# Patient Record
Sex: Male | Born: 1965 | Race: Black or African American | Hispanic: No | Marital: Married | Smoking: Never smoker
Health system: Southern US, Community
[De-identification: ages and names within clinical notes are randomized; demographics above are authoritative.]

## PROBLEM LIST (undated history)

## (undated) DIAGNOSIS — I10 Essential (primary) hypertension: Secondary | ICD-10-CM

## (undated) DIAGNOSIS — J302 Other seasonal allergic rhinitis: Secondary | ICD-10-CM

## (undated) DIAGNOSIS — M67439 Ganglion, unspecified wrist: Secondary | ICD-10-CM

## (undated) DIAGNOSIS — S63599A Other specified sprain of unspecified wrist, initial encounter: Secondary | ICD-10-CM

## (undated) HISTORY — PX: KNEE ARTHROSCOPY: SHX127

## (undated) HISTORY — PX: ORCHIOPEXY: SHX479

## (undated) HISTORY — PX: CYST EXCISION: SHX5701

---

## 2011-03-24 ENCOUNTER — Emergency Department (INDEPENDENT_AMBULATORY_CARE_PROVIDER_SITE_OTHER): Payer: Managed Care, Other (non HMO)

## 2011-03-24 ENCOUNTER — Emergency Department (HOSPITAL_BASED_OUTPATIENT_CLINIC_OR_DEPARTMENT_OTHER)
Admission: EM | Admit: 2011-03-24 | Discharge: 2011-03-24 | Disposition: A | Discharge status: Home / Self Care | Payer: Managed Care, Other (non HMO) | Attending: Emergency Medicine | Admitting: Emergency Medicine

## 2011-03-24 DIAGNOSIS — R059 Cough, unspecified: Secondary | ICD-10-CM | POA: Insufficient documentation

## 2011-03-24 DIAGNOSIS — R05 Cough: Secondary | ICD-10-CM | POA: Insufficient documentation

## 2011-03-24 DIAGNOSIS — R0989 Other specified symptoms and signs involving the circulatory and respiratory systems: Secondary | ICD-10-CM

## 2011-03-24 DIAGNOSIS — I10 Essential (primary) hypertension: Secondary | ICD-10-CM | POA: Insufficient documentation

## 2011-03-24 DIAGNOSIS — J40 Bronchitis, not specified as acute or chronic: Secondary | ICD-10-CM | POA: Insufficient documentation

## 2011-03-24 HISTORY — DX: Essential (primary) hypertension: I10

## 2011-03-24 MED ORDER — AZITHROMYCIN 250 MG PO TABS: 250.0000 mg | ORAL_TABLET | Freq: Every day | ORAL | Status: AC

## 2011-03-24 MED ORDER — IBUPROFEN 800 MG PO TABS
800.0000 mg | ORAL_TABLET | Freq: Once | ORAL | Status: AC
  Administered 2011-03-24: 800 mg via ORAL
  Filled 2011-03-24: qty 1

## 2011-03-24 MED ORDER — IBUPROFEN 800 MG PO TABS: 800.0000 mg | ORAL_TABLET | Freq: Three times a day (TID) | ORAL | Status: AC

## 2011-03-24 MED ORDER — HYDROCOD POLST-CHLORPHEN POLST 10-8 MG/5ML PO LQCR: 5.0000 mL | Freq: Two times a day (BID) | ORAL | Status: DC

## 2011-03-24 NOTE — ED Provider Notes (Addendum)
History     CSN: [id number] Arrival date & time: 03/24/2011  8:49 PM   First MD Initiated Contact with Patient 03/24/11 2100      Chief Complaint  Patient presents with  . URI   Patient describes a productive cough in the morning, feels hot, but no documented fevers. He states he began having muscle aches, runny nose, and cold symptoms last week. Now the symptoms have included more of sore throat in the morning and productive cough. No nausea, vomiting. No recent illnesses. Patient does not smoke. (Consider location/radiation/quality/duration/timing/severity/associated sxs/prior treatment) HPI  Past Medical History  Diagnosis Date  . Hypertension     Past Surgical History  Procedure Date  . Knee surgery   . Surgery scrotal / testicular     No family history on file.  History  Substance Use Topics  . Smoking status: Never Smoker   . Smokeless tobacco: Not on file  . Alcohol Use: No      Review of Systems  All other systems reviewed and are negative.    Allergies  Review of patient's allergies indicates not on file.  Home Medications  No current outpatient prescriptions on file.  BP 140/89  Pulse 89  Temp(Src) 98.4 F (36.9 C) (Oral)  Resp 18  Ht 6\' 1"  (1.854 m)  Wt 207 lb (93.895 kg)  BMI 27.31 kg/m2  SpO2 100%  Physical Exam  Constitutional: He is oriented to person, place, and time. He appears well-developed and well-nourished.  HENT:  Head: Normocephalic and atraumatic.  Eyes: Conjunctivae and EOM are normal. Pupils are equal, round, and reactive to light.  Neck: Neck supple.  Cardiovascular: Normal rate and regular rhythm.  Exam reveals no gallop and no friction rub.   No murmur heard. Pulmonary/Chest: Breath sounds normal. No respiratory distress. He has no wheezes. He has no rales. He exhibits no tenderness.  Abdominal: Soft. Bowel sounds are normal. He exhibits no distension. There is no tenderness. There is no rebound and no guarding.    Musculoskeletal: Normal range of motion.  Neurological: He is alert and oriented to person, place, and time. No cranial nerve deficit. Coordination normal.  Skin: Skin is warm and dry. No rash noted.  Psychiatric: He has a normal mood and affect.    ED Course  Procedures (including critical care time)  Labs Reviewed - No data to display No results found.   No diagnosis found.    MDM  Pt is seen and examined;  Initial history and physical completed.  Will follow.          Myda Detwiler A. Patrica Duel, MD 03/24/11 2105  No results found for this or any previous visit. Dg Chest 2 View  03/24/2011  *RADIOLOGY REPORT*  Clinical Data: Cough, congestion.  CHEST - 2 VIEW  Comparison: None.  Findings: Lungs are clear. No pleural effusion or pneumothorax. The cardiomediastinal contours are within normal limits. The visualized bones and soft tissues are without significant appreciable abnormality. Mild lower thoracic degenerative changes.  IMPRESSION: No acute cardiopulmonary process.  Original Report Authenticated By: Waneta Martins, M.D.      Lanesha Azzaro A. Patrica Duel, MD 03/24/11 2155

## 2011-03-24 NOTE — ED Notes (Signed)
D/c home with friend/family member- rx x 3 given for tussionex, zpack, and ibuprofen

## 2011-03-24 NOTE — ED Notes (Signed)
Prod cough in am, "feels hot"-no temp taken, aching

## 2011-09-24 ENCOUNTER — Encounter (HOSPITAL_BASED_OUTPATIENT_CLINIC_OR_DEPARTMENT_OTHER): Payer: Self-pay | Admitting: Emergency Medicine

## 2011-09-24 ENCOUNTER — Emergency Department (HOSPITAL_BASED_OUTPATIENT_CLINIC_OR_DEPARTMENT_OTHER)
Admission: EM | Admit: 2011-09-24 | Discharge: 2011-09-24 | Disposition: A | Discharge status: Home / Self Care | Payer: Managed Care, Other (non HMO) | Attending: Emergency Medicine | Admitting: Emergency Medicine

## 2011-09-24 DIAGNOSIS — I10 Essential (primary) hypertension: Secondary | ICD-10-CM | POA: Insufficient documentation

## 2011-09-24 DIAGNOSIS — J329 Chronic sinusitis, unspecified: Secondary | ICD-10-CM

## 2011-09-24 HISTORY — DX: Other seasonal allergic rhinitis: J30.2

## 2011-09-24 MED ORDER — IBUPROFEN 800 MG PO TABS
800.0000 mg | ORAL_TABLET | Freq: Once | ORAL | Status: AC
  Administered 2011-09-24: 800 mg via ORAL
  Filled 2011-09-24: qty 1

## 2011-09-24 MED ORDER — HYDROCOD POLST-CHLORPHEN POLST 10-8 MG/5ML PO LQCR: 5.0000 mL | Freq: Two times a day (BID) | ORAL | Status: DC

## 2011-09-24 MED ORDER — AZITHROMYCIN 250 MG PO TABS: 250.0000 mg | ORAL_TABLET | Freq: Every day | ORAL | Status: AC

## 2011-09-24 NOTE — ED Notes (Signed)
C/o sore throat, head congestion, yellow nasal drainage, and fever.  Using OTC medication without relief.

## 2011-09-24 NOTE — Discharge Instructions (Signed)
Sinusitis Sinuses are air pockets within the bones of your face. The growth of bacteria within a sinus leads to infection. The infection prevents the sinuses from draining. This infection is called sinusitis. SYMPTOMS  There will be different areas of pain depending on which sinuses have become infected.  The maxillary sinuses often produce pain beneath the eyes.   Frontal sinusitis may cause pain in the middle of the forehead and above the eyes.  Other problems (symptoms) include:  Toothaches.   Colored, pus-like (purulent) drainage from the nose.   Swelling, warmth, and tenderness over the sinus areas may be signs of infection.  TREATMENT  Sinusitis is most often determined by an exam.X-rays may be taken. If x-rays have been taken, make sure you obtain your results or find out how you are to obtain them. Your caregiver may give you medications (antibiotics). These are medications that will help kill the bacteria causing the infection. You may also be given a medication (decongestant) that helps to reduce sinus swelling.  HOME CARE INSTRUCTIONS   Only take over-the-counter or prescription medicines for pain, discomfort, or fever as directed by your caregiver.   Drink extra fluids. Fluids help thin the mucus so your sinuses can drain more easily.   Applying either moist heat or ice packs to the sinus areas may help relieve discomfort.   Use saline nasal sprays to help moisten your sinuses. The sprays can be found at your local drugstore.  SEEK IMMEDIATE MEDICAL CARE IF:  You have a fever.   You have increasing pain, severe headaches, or toothache.   You have nausea, vomiting, or drowsiness.   You develop unusual swelling around the face or trouble seeing.  MAKE SURE YOU:   Understand these instructions.   Will watch your condition.   Will get help right away if you are not doing well or get worse.  Document Released: 05/03/2005 Document Revised: 04/22/2011 Document Reviewed:  11/30/2006 [facility] Patient Information 2012 [place], [place].    Narcotic and benzodiazepine use may cause drowsiness, slowed breathing or dependence.  Please use with caution and do not drive, operate machinery or watch young children alone while taking them.  Taking combinations of these medications or drinking alcohol will potentiate these effects.

## 2011-09-28 ENCOUNTER — Emergency Department (HOSPITAL_BASED_OUTPATIENT_CLINIC_OR_DEPARTMENT_OTHER)
Admission: EM | Admit: 2011-09-28 | Discharge: 2011-09-29 | Disposition: A | Discharge status: Home / Self Care | Payer: Managed Care, Other (non HMO) | Attending: Emergency Medicine | Admitting: Emergency Medicine

## 2011-09-28 ENCOUNTER — Emergency Department (INDEPENDENT_AMBULATORY_CARE_PROVIDER_SITE_OTHER): Payer: Managed Care, Other (non HMO)

## 2011-09-28 ENCOUNTER — Encounter (HOSPITAL_BASED_OUTPATIENT_CLINIC_OR_DEPARTMENT_OTHER): Payer: Self-pay | Admitting: Emergency Medicine

## 2011-09-28 DIAGNOSIS — I10 Essential (primary) hypertension: Secondary | ICD-10-CM | POA: Insufficient documentation

## 2011-09-28 DIAGNOSIS — R5381 Other malaise: Secondary | ICD-10-CM | POA: Insufficient documentation

## 2011-09-28 DIAGNOSIS — R0602 Shortness of breath: Secondary | ICD-10-CM | POA: Insufficient documentation

## 2011-09-28 DIAGNOSIS — R079 Chest pain, unspecified: Secondary | ICD-10-CM

## 2011-09-28 DIAGNOSIS — R42 Dizziness and giddiness: Secondary | ICD-10-CM

## 2011-09-28 DIAGNOSIS — J40 Bronchitis, not specified as acute or chronic: Secondary | ICD-10-CM | POA: Insufficient documentation

## 2011-09-28 LAB — BASIC METABOLIC PANEL
BUN: 20 mg/dL (ref 6–23)
Calcium: 9.8 mg/dL (ref 8.4–10.5)
Creatinine, Ser: 1.2 mg/dL (ref 0.50–1.35)
GFR calc Af Amer: 84 mL/min — ABNORMAL LOW (ref >90)
GFR calc non Af Amer: 72 mL/min — ABNORMAL LOW (ref >90)

## 2011-09-28 LAB — DIFFERENTIAL
Basophils Relative: 0 % (ref 0–1)
Eosinophils Absolute: 0.1 10*3/uL (ref 0.0–0.7)
Eosinophils Relative: 2 % (ref 0–5)
Monocytes Absolute: 0.4 10*3/uL (ref 0.1–1.0)
Monocytes Relative: 8 % (ref 3–12)

## 2011-09-28 LAB — CARDIAC PANEL(CRET KIN+CKTOT+MB+TROPI): Relative Index: 1.1 (ref 0.0–2.5)

## 2011-09-28 LAB — CBC
HCT: 48.6 % (ref 39.0–52.0)
Hemoglobin: 17.2 g/dL — ABNORMAL HIGH (ref 13.0–17.0)
MCH: 29.4 pg (ref 26.0–34.0)
MCHC: 35.4 g/dL (ref 30.0–36.0)

## 2011-09-28 MED ORDER — ALBUTEROL SULFATE HFA 108 (90 BASE) MCG/ACT IN AERS
INHALATION_SPRAY | RESPIRATORY_TRACT | Status: AC
  Administered 2011-09-28: 2 via RESPIRATORY_TRACT
  Filled 2011-09-28: qty 6.7

## 2011-09-28 MED ORDER — PREDNISONE 50 MG PO TABS
60.0000 mg | ORAL_TABLET | Freq: Once | ORAL | Status: AC
  Administered 2011-09-28: 60 mg via ORAL
  Filled 2011-09-28: qty 1

## 2011-09-28 MED ORDER — ALBUTEROL SULFATE HFA 108 (90 BASE) MCG/ACT IN AERS
2.0000 | INHALATION_SPRAY | RESPIRATORY_TRACT | Status: DC | PRN
  Administered 2011-09-28: 2 via RESPIRATORY_TRACT

## 2011-09-28 MED ORDER — ALBUTEROL SULFATE (5 MG/ML) 0.5% IN NEBU
5.0000 mg | INHALATION_SOLUTION | Freq: Once | RESPIRATORY_TRACT | Status: AC
  Administered 2011-09-28: 5 mg via RESPIRATORY_TRACT
  Filled 2011-09-28: qty 1

## 2011-09-28 MED ORDER — LORATADINE 10 MG PO TABS: 10.0000 mg | ORAL_TABLET | Freq: Every day | ORAL | Status: DC

## 2011-09-28 MED ORDER — PREDNISONE 20 MG PO TABS: 60.0000 mg | ORAL_TABLET | Freq: Every day | ORAL | Status: AC

## 2011-09-28 NOTE — ED Notes (Signed)
Pt. Is going to have chest x ray done and will go via stretcher by Willa Rough

## 2011-09-28 NOTE — Discharge Instructions (Signed)

## 2011-09-28 NOTE — ED Provider Notes (Signed)
History     CSN: [id number]  Arrival date & time 09/28/11  2107   First MD Initiated Contact with Patient 09/28/11 2210      Chief Complaint  Patient presents with  . Chest Pain    (Consider location/radiation/quality/duration/timing/severity/associated sxs/prior treatment) Patient is a 46 y.o. male presenting with shortness of breath. The history is provided by the patient. No language interpreter was used.  Shortness of Breath  The current episode started yesterday. The onset was gradual. The problem occurs continuously. The problem has been gradually worsening. The problem is moderate. The symptoms are relieved by nothing. The symptoms are aggravated by activity. Associated symptoms include chest pressure, rhinorrhea, cough and shortness of breath. Pertinent negatives include no chest pain, no orthopnea, no fever, no sore throat and no wheezing. The cough is dry. Nothing relieves the cough. Nothing worsens the cough.    Past Medical History  Diagnosis Date  . Hypertension   . Seasonal allergies     Past Surgical History  Procedure Date  . Knee surgery   . Surgery scrotal / testicular     No family history on file.  History  Substance Use Topics  . Smoking status: Never Smoker   . Smokeless tobacco: Not on file  . Alcohol Use: No      Review of Systems  Constitutional: Positive for activity change, appetite change and fatigue. Negative for fever and chills.  HENT: Positive for congestion and rhinorrhea. Negative for sore throat, neck pain and neck stiffness.   Respiratory: Positive for cough, chest tightness and shortness of breath. Negative for wheezing.   Cardiovascular: Negative for chest pain, palpitations and orthopnea.  Gastrointestinal: Negative for nausea, vomiting and abdominal pain.  Genitourinary: Negative for dysuria, urgency, frequency and flank pain.  Musculoskeletal: Negative for myalgias, back pain and arthralgias.  Neurological: Negative for  dizziness, weakness, light-headedness, numbness and headaches.  All other systems reviewed and are negative.    Allergies  Review of patient's allergies indicates no known allergies.  Home Medications   Current Outpatient Rx  Name Route Sig Dispense Refill  . ALBUTEROL SULFATE HFA 108 (90 BASE) MCG/ACT IN AERS Inhalation Inhale 2 puffs into the lungs every 6 (six) hours as needed. For shortness of breath    . AZITHROMYCIN 250 MG PO TABS Oral Take 1 tablet (250 mg total) by mouth daily. 6 tablet 0  . LISINOPRIL 40 MG PO TABS Oral Take 40 mg by mouth daily.    Marland Kitchen PREDNISONE 20 MG PO TABS Oral Take 3 tablets (60 mg total) by mouth daily. 15 tablet 0    BP 146/91  Pulse 88  Temp 98.8 F (37.1 C)  Resp 21  SpO2 99%  Physical Exam  Nursing note and vitals reviewed. Constitutional: He is oriented to person, place, and time. He appears well-developed and well-nourished.  HENT:  Head: Normocephalic and atraumatic.  Mouth/Throat: Oropharynx is clear and moist.  Eyes: Conjunctivae and EOM are normal. Pupils are equal, round, and reactive to light.  Neck: Normal range of motion. Neck supple.  Cardiovascular: Normal rate, regular rhythm, normal heart sounds and intact distal pulses.  Exam reveals no gallop and no friction rub.   No murmur heard. Pulmonary/Chest: Effort normal. He has no wheezes.       Diminished breath sounds diffusely  Abdominal: Soft. Bowel sounds are normal. There is no tenderness. There is no rebound and no guarding.  Musculoskeletal: Normal range of motion. He exhibits no edema and no tenderness.  Neurological: He is alert and oriented to person, place, and time. No cranial nerve deficit.  Skin: Skin is warm and dry.    ED Course  Procedures (including critical care time)   Date: 09/28/2011  Rate: 95  Rhythm: normal sinus rhythm  QRS Axis: normal  Intervals: normal  ST/T Wave abnormalities: nonspecific T wave changes  Conduction Disutrbances:none   Narrative Interpretation:   Old EKG Reviewed: none available  Labs Reviewed  CBC - Abnormal; Notable for the following:    RBC 5.86 (*)    Hemoglobin 17.2 (*)    All other components within normal limits  BASIC METABOLIC PANEL - Abnormal; Notable for the following:    Glucose, Bld 134 (*)    GFR calc non Af Amer 72 (*)    GFR calc Af Amer 84 (*)    All other components within normal limits  CARDIAC PANEL(CRET KIN+CKTOT+MB+TROPI) - Abnormal; Notable for the following:    Total CK 280 (*)    All other components within normal limits  DIFFERENTIAL   Dg Chest 2 View  09/28/2011  *RADIOLOGY REPORT*  Clinical Data: Mid chest pain and dizziness.  CHEST - 2 VIEW  Comparison: 03/24/2011.  Findings: Normal heart size and pulmonary vascularity.  No focal airspace consolidation in the lungs.  No blunting of costophrenic angles.  No pneumothorax.  Mild degenerative changes in the spine. No significant change since previous study.  IMPRESSION: No evidence of active pulmonary disease.  Original Report Authenticated By: Marlon Pel, M.D.     1. Bronchitis       MDM  PERC negative with low clinical Gestalt for pulmonary embolus. No concern for ACS as a cause of his pain. His symptoms are secondary to a viral illness with bronchitis. He had resolution of his symptoms with the breathing treatments. He also received steroids. He'll be discharged home with an albuterol inhaler and prednisone for his symptoms. I will also provide him with an allergy medication.        Dayton Bailiff, MD 09/28/11 2342

## 2011-09-28 NOTE — ED Notes (Signed)
Pt. Has not taken any blood pressure medicine.

## 2011-09-28 NOTE — ED Notes (Signed)
Pt. Back from x ray with no distress noted.  VSS and monitor reveals  SR at rate 88.

## 2011-09-28 NOTE — ED Notes (Signed)
Vital signs stable. 

## 2011-09-28 NOTE — ED Notes (Signed)
Pt. Labs are in lab at present time.

## 2011-09-28 NOTE — ED Notes (Signed)
Chest pain onset today.  Constant and sharp.  Also headache. Face flushed.  Skin warm and dry

## 2011-09-30 ENCOUNTER — Encounter (HOSPITAL_BASED_OUTPATIENT_CLINIC_OR_DEPARTMENT_OTHER): Payer: Self-pay | Admitting: Emergency Medicine

## 2011-09-30 NOTE — ED Provider Notes (Signed)
History     CSN: [id number]  Arrival date & time 09/24/11  4098   First MD Initiated Contact with Patient 09/24/11 0845      Chief Complaint  Patient presents with  . Facial Pain  . Nasal Congestion    (Consider location/radiation/quality/duration/timing/severity/associated sxs/prior treatment) HPI Comments: Facial discomfort, nasal congestion and sinus pain.  No fevers, has nasal drainage.  Complains of chills, possibly fevers at home.  Now coughing some and has sore throat due to coughing.  No sig change in appetite.  Has been taking OTC meds for a few days with no sig relief.  Symtpoms began about 1 weeks ago.     The history is provided by the patient and the spouse.    Past Medical History  Diagnosis Date  . Hypertension   . Seasonal allergies     Past Surgical History  Procedure Date  . Knee surgery   . Surgery scrotal / testicular     History reviewed. No pertinent family history.  History  Substance Use Topics  . Smoking status: Never Smoker   . Smokeless tobacco: Not on file  . Alcohol Use: No      Review of Systems  Constitutional: Positive for fever and chills. Negative for appetite change.  HENT: Positive for congestion, sore throat, rhinorrhea, postnasal drip and sinus pressure. Negative for trouble swallowing.   Respiratory: Positive for cough. Negative for shortness of breath.   Cardiovascular: Negative for chest pain.    Allergies  Review of patient's allergies indicates no known allergies.  Home Medications   Current Outpatient Rx  Name Route Sig Dispense Refill  . ALBUTEROL SULFATE HFA 108 (90 BASE) MCG/ACT IN AERS Inhalation Inhale 2 puffs into the lungs every 6 (six) hours as needed. For shortness of breath    . AZITHROMYCIN 250 MG PO TABS Oral Take 1 tablet (250 mg total) by mouth daily. 6 tablet 0  . LISINOPRIL 40 MG PO TABS Oral Take 40 mg by mouth daily.    Marland Kitchen LORATADINE 10 MG PO TABS Oral Take 1 tablet (10 mg total) by mouth daily.  30 tablet 0  . PREDNISONE 20 MG PO TABS Oral Take 3 tablets (60 mg total) by mouth daily. 15 tablet 0    BP 181/123  Pulse 93  Temp(Src) 97.3 F (36.3 C) (Rectal)  Resp 18  Ht 6\' 1"  (1.854 m)  Wt 205 lb 6 oz (93.157 kg)  BMI 27.10 kg/m2  SpO2 98%  Physical Exam  Nursing note and vitals reviewed. Constitutional: He appears well-developed. No distress.  HENT:  Head: Normocephalic and atraumatic.  Nose: Mucosal edema and rhinorrhea present. Right sinus exhibits maxillary sinus tenderness and frontal sinus tenderness. Left sinus exhibits maxillary sinus tenderness and frontal sinus tenderness.  Mouth/Throat: Uvula is midline and oropharynx is clear and moist.  Eyes: Pupils are equal, round, and reactive to light. No scleral icterus.  Neck: Normal range of motion. Neck supple. No tracheal deviation present. No thyromegaly present.  Cardiovascular: Normal rate.   Pulmonary/Chest: Effort normal. No stridor. No respiratory distress. He has no wheezes. He has no rales.  Abdominal: Soft. He exhibits no distension. There is no tenderness. There is no rebound.  Skin: He is not diaphoretic.    ED Course  Procedures (including critical care time)  Labs Reviewed - No data to display     1. Sinusitis    RA sat is 98% and normal.     MDM  Length of symptoms,  will treat as bacterial sinusitis.  VS are normal.  No sig distress, able to swallow, afebrile.  Z pak given.  Pt has meds for allergies already at home.         Gavin Pound. Oletta Lamas, MD 09/30/11 4098

## 2012-06-22 ENCOUNTER — Other Ambulatory Visit: Payer: Self-pay | Admitting: Orthopedic Surgery

## 2012-06-22 DIAGNOSIS — M25531 Pain in right wrist: Secondary | ICD-10-CM

## 2012-07-04 ENCOUNTER — Ambulatory Visit
Admission: RE | Admit: 2012-07-04 | Discharge: 2012-07-04 | Disposition: A | Discharge status: Home / Self Care | Payer: Managed Care, Other (non HMO) | Source: Ambulatory Visit | Attending: Orthopedic Surgery | Admitting: Orthopedic Surgery

## 2012-07-04 DIAGNOSIS — M25531 Pain in right wrist: Secondary | ICD-10-CM

## 2012-07-04 MED ORDER — IOHEXOL 180 MG/ML  SOLN
3.0000 mL | Freq: Once | INTRAMUSCULAR | Status: AC | PRN
  Administered 2012-07-04: 3 mL via INTRA_ARTICULAR

## 2012-08-01 ENCOUNTER — Encounter (HOSPITAL_BASED_OUTPATIENT_CLINIC_OR_DEPARTMENT_OTHER): Payer: Self-pay | Admitting: Family Medicine

## 2012-08-01 ENCOUNTER — Emergency Department (HOSPITAL_BASED_OUTPATIENT_CLINIC_OR_DEPARTMENT_OTHER)
Admission: EM | Admit: 2012-08-01 | Discharge: 2012-08-01 | Disposition: A | Discharge status: Home / Self Care | Payer: Managed Care, Other (non HMO) | Attending: Emergency Medicine | Admitting: Emergency Medicine

## 2012-08-01 ENCOUNTER — Emergency Department (HOSPITAL_BASED_OUTPATIENT_CLINIC_OR_DEPARTMENT_OTHER): Payer: Managed Care, Other (non HMO)

## 2012-08-01 DIAGNOSIS — R5381 Other malaise: Secondary | ICD-10-CM | POA: Insufficient documentation

## 2012-08-01 DIAGNOSIS — Z79899 Other long term (current) drug therapy: Secondary | ICD-10-CM | POA: Insufficient documentation

## 2012-08-01 DIAGNOSIS — R509 Fever, unspecified: Secondary | ICD-10-CM | POA: Insufficient documentation

## 2012-08-01 DIAGNOSIS — J40 Bronchitis, not specified as acute or chronic: Secondary | ICD-10-CM | POA: Insufficient documentation

## 2012-08-01 DIAGNOSIS — I10 Essential (primary) hypertension: Secondary | ICD-10-CM | POA: Insufficient documentation

## 2012-08-01 MED ORDER — ALBUTEROL SULFATE HFA 108 (90 BASE) MCG/ACT IN AERS
2.0000 | INHALATION_SPRAY | RESPIRATORY_TRACT | Status: DC | PRN
  Administered 2012-08-01: 2 via RESPIRATORY_TRACT
  Filled 2012-08-01: qty 6.7

## 2012-08-01 MED ORDER — HYDROCOD POLST-CHLORPHEN POLST 10-8 MG/5ML PO LQCR: 5.0000 mL | Freq: Two times a day (BID) | ORAL | Status: DC | PRN

## 2012-08-01 NOTE — ED Notes (Signed)
Pt c/o right sided chest pain x 5 days intermittent and worse with cough and deep inspiration. Pt reports productive cough and fever and chills.

## 2012-08-01 NOTE — ED Provider Notes (Signed)
History     CSN: [id number]  Arrival date & time 08/01/12  4098   First MD Initiated Contact with Patient 08/01/12 [phone]      Chief Complaint  Patient presents with  . Cough    (Consider location/radiation/quality/duration/timing/severity/associated sxs/prior treatment) Patient is a 47 y.o. male presenting with cough.  Cough  Pt reports he has had a productive cough for the past 7-10 days, associated with subjective fevers, general malaise and moderate aching R sided chest soreness with coughing. Denies any SOB. No nasal congestion or sinus pressure. Has had bronchitis in the past. Has not taken any OTC meds, symptoms are worse at night.   Past Medical History  Diagnosis Date  . Hypertension   . Seasonal allergies     Past Surgical History  Procedure Laterality Date  . Knee surgery    . Surgery scrotal / testicular      No family history on file.  History  Substance Use Topics  . Smoking status: Never Smoker   . Smokeless tobacco: Not on file  . Alcohol Use: Yes      Review of Systems  Respiratory: Positive for cough.    All other systems reviewed and are negative except as noted in HPI.   Allergies  Review of patient's allergies indicates no known allergies.  Home Medications   Current Outpatient Rx  Name  Route  Sig  Dispense  Refill  . albuterol (PROVENTIL HFA;VENTOLIN HFA) 108 (90 BASE) MCG/ACT inhaler   Inhalation   Inhale 2 puffs into the lungs every 6 (six) hours as needed. For shortness of breath         . lisinopril (PRINIVIL,ZESTRIL) 40 MG tablet   Oral   Take 40 mg by mouth daily.         Marland Kitchen loratadine (CLARITIN) 10 MG tablet   Oral   Take 1 tablet (10 mg total) by mouth daily.   30 tablet   0     BP 162/112  Pulse 80  Temp(Src) 98.3 F (36.8 C) (Oral)  Resp 16  Ht 6\' 1"  (1.854 m)  Wt 200 lb (90.719 kg)  BMI 26.39 kg/m2  SpO2 100%  Physical Exam  Nursing note and vitals reviewed. Constitutional: He is oriented to person,  place, and time. He appears well-developed and well-nourished.  HENT:  Head: Normocephalic and atraumatic.  Eyes: EOM are normal. Pupils are equal, round, and reactive to light.  Neck: Normal range of motion. Neck supple.  Cardiovascular: Normal rate, normal heart sounds and intact distal pulses.   Pulmonary/Chest: Effort normal and breath sounds normal.  Abdominal: Bowel sounds are normal. He exhibits no distension. There is no tenderness.  Musculoskeletal: Normal range of motion. He exhibits no edema and no tenderness.  Neurological: He is alert and oriented to person, place, and time. He has normal strength. No cranial nerve deficit or sensory deficit.  Skin: Skin is warm and dry. No rash noted.  Psychiatric: He has a normal mood and affect.    ED Course  Procedures (including critical care time)  Labs Reviewed - No data to display Dg Chest 2 View  08/01/2012  *RADIOLOGY REPORT*  Clinical Data: Chest pain and cough.  CHEST - 2 VIEW  Comparison: 09/28/2011  Findings: Cardiac and mediastinal contours appear normal.  The lungs appear clear.  No pleural effusion is identified.  Mild thoracic spondylosis noted.  IMPRESSION:  1.  No significant abnormality identified.   Original Report Authenticated By: Gaylyn Rong, M.D.  1. Bronchitis       MDM   Date: 08/01/2012  Rate: 78  Rhythm: normal sinus rhythm  QRS Axis: normal  Intervals: normal  ST/T Wave abnormalities: nonspecific T wave changes  Conduction Disutrbances:none  Narrative Interpretation:   Old EKG Reviewed: unchanged  Pt with history of HTN reports he was not taking his meds until he went to get a refill yesterday. He has been treated for bronchitis before with HFA. Will check CXR for PNA.            Charles B. Bernette Mayers, MD 08/01/12 2000

## 2012-09-14 DIAGNOSIS — M67439 Ganglion, unspecified wrist: Secondary | ICD-10-CM

## 2012-09-14 DIAGNOSIS — S63599A Other specified sprain of unspecified wrist, initial encounter: Secondary | ICD-10-CM

## 2012-09-14 HISTORY — DX: Other specified sprain of unspecified wrist, initial encounter: S63.599A

## 2012-09-14 HISTORY — DX: Ganglion, unspecified wrist: M67.439

## 2012-09-25 ENCOUNTER — Other Ambulatory Visit: Payer: Self-pay | Admitting: Orthopedic Surgery

## 2012-10-03 ENCOUNTER — Encounter (HOSPITAL_BASED_OUTPATIENT_CLINIC_OR_DEPARTMENT_OTHER): Payer: Self-pay | Admitting: *Deleted

## 2012-10-03 NOTE — Pre-Procedure Instructions (Signed)
To come for BMET 

## 2012-10-10 ENCOUNTER — Encounter (HOSPITAL_BASED_OUTPATIENT_CLINIC_OR_DEPARTMENT_OTHER)
Admission: RE | Admit: 2012-10-10 | Discharge: 2012-10-10 | Disposition: A | Discharge status: Home / Self Care | Payer: Managed Care, Other (non HMO) | Source: Ambulatory Visit | Attending: Orthopedic Surgery | Admitting: Orthopedic Surgery

## 2012-10-10 LAB — BASIC METABOLIC PANEL
BUN: 16 mg/dL (ref 6–23)
Calcium: 9.4 mg/dL (ref 8.4–10.5)
Creatinine, Ser: 1.14 mg/dL (ref 0.50–1.35)
GFR calc non Af Amer: 76 mL/min — ABNORMAL LOW (ref >90)
Glucose, Bld: 105 mg/dL — ABNORMAL HIGH (ref 70–99)

## 2012-10-11 ENCOUNTER — Encounter (HOSPITAL_BASED_OUTPATIENT_CLINIC_OR_DEPARTMENT_OTHER): Payer: Self-pay | Admitting: Anesthesiology

## 2012-10-11 ENCOUNTER — Ambulatory Visit (HOSPITAL_BASED_OUTPATIENT_CLINIC_OR_DEPARTMENT_OTHER)
Admission: RE | Admit: 2012-10-11 | Discharge: 2012-10-11 | Disposition: A | Discharge status: Home / Self Care | Payer: Managed Care, Other (non HMO) | Source: Ambulatory Visit | Attending: Orthopedic Surgery | Admitting: Orthopedic Surgery

## 2012-10-11 ENCOUNTER — Ambulatory Visit (HOSPITAL_BASED_OUTPATIENT_CLINIC_OR_DEPARTMENT_OTHER): Payer: Managed Care, Other (non HMO) | Admitting: Anesthesiology

## 2012-10-11 ENCOUNTER — Encounter (HOSPITAL_BASED_OUTPATIENT_CLINIC_OR_DEPARTMENT_OTHER)
Admission: RE | Disposition: A | Discharge status: Home / Self Care | Payer: Self-pay | Source: Ambulatory Visit | Attending: Orthopedic Surgery

## 2012-10-11 ENCOUNTER — Encounter (HOSPITAL_BASED_OUTPATIENT_CLINIC_OR_DEPARTMENT_OTHER): Payer: Self-pay | Admitting: *Deleted

## 2012-10-11 DIAGNOSIS — M249 Joint derangement, unspecified: Secondary | ICD-10-CM | POA: Insufficient documentation

## 2012-10-11 DIAGNOSIS — Z79899 Other long term (current) drug therapy: Secondary | ICD-10-CM | POA: Insufficient documentation

## 2012-10-11 DIAGNOSIS — M24139 Other articular cartilage disorders, unspecified wrist: Secondary | ICD-10-CM | POA: Insufficient documentation

## 2012-10-11 DIAGNOSIS — I10 Essential (primary) hypertension: Secondary | ICD-10-CM | POA: Insufficient documentation

## 2012-10-11 DIAGNOSIS — M6740 Ganglion, unspecified site: Secondary | ICD-10-CM | POA: Insufficient documentation

## 2012-10-11 DIAGNOSIS — M9420 Chondromalacia, unspecified site: Secondary | ICD-10-CM | POA: Insufficient documentation

## 2012-10-11 HISTORY — DX: Ganglion, unspecified wrist: M67.439

## 2012-10-11 HISTORY — DX: Other specified sprain of unspecified wrist, initial encounter: S63.599A

## 2012-10-11 HISTORY — PX: WRIST ARTHROSCOPY: SHX838

## 2012-10-11 SURGERY — ARTHROSCOPY, WRIST
Anesthesia: General | Site: Wrist | Laterality: Right | Wound class: Clean

## 2012-10-11 MED ORDER — OXYCODONE-ACETAMINOPHEN 7.5-325 MG PO TABS: 1.0000 | ORAL_TABLET | ORAL | Status: DC | PRN

## 2012-10-11 MED ORDER — LACTATED RINGERS IV SOLN
INTRAVENOUS | Status: DC
  Administered 2012-10-11 (×2): via INTRAVENOUS

## 2012-10-11 MED ORDER — CHLORHEXIDINE GLUCONATE 4 % EX LIQD: 60.0000 mL | Freq: Once | CUTANEOUS | Status: DC

## 2012-10-11 MED ORDER — HYDROMORPHONE HCL PF 1 MG/ML IJ SOLN: 0.2500 mg | INTRAMUSCULAR | Status: DC | PRN

## 2012-10-11 MED ORDER — SODIUM CHLORIDE 0.9 % IR SOLN
Status: DC | PRN
  Administered 2012-10-11: 3000 mL

## 2012-10-11 MED ORDER — OXYCODONE HCL 5 MG/5ML PO SOLN: 5.0000 mg | Freq: Once | ORAL | Status: DC | PRN

## 2012-10-11 MED ORDER — FENTANYL CITRATE 0.05 MG/ML IJ SOLN
50.0000 ug | INTRAMUSCULAR | Status: DC | PRN
  Administered 2012-10-11: 100 ug via INTRAVENOUS

## 2012-10-11 MED ORDER — BUPIVACAINE-EPINEPHRINE PF 0.5-1:200000 % IJ SOLN
INTRAMUSCULAR | Status: DC | PRN
  Administered 2012-10-11: 25 mL

## 2012-10-11 MED ORDER — MIDAZOLAM HCL 2 MG/ML PO SYRP: 12.0000 mg | ORAL_SOLUTION | Freq: Once | ORAL | Status: DC | PRN

## 2012-10-11 MED ORDER — MIDAZOLAM HCL 2 MG/2ML IJ SOLN
1.0000 mg | INTRAMUSCULAR | Status: DC | PRN
  Administered 2012-10-11: 2 mg via INTRAVENOUS

## 2012-10-11 MED ORDER — DEXAMETHASONE SODIUM PHOSPHATE 10 MG/ML IJ SOLN
INTRAMUSCULAR | Status: DC | PRN
  Administered 2012-10-11: 10 mg via INTRAVENOUS

## 2012-10-11 MED ORDER — DEXAMETHASONE SODIUM PHOSPHATE 4 MG/ML IJ SOLN
INTRAMUSCULAR | Status: DC | PRN
  Administered 2012-10-11: 4 mg

## 2012-10-11 MED ORDER — OXYCODONE HCL 5 MG PO TABS: 5.0000 mg | ORAL_TABLET | Freq: Once | ORAL | Status: DC | PRN

## 2012-10-11 MED ORDER — CEFAZOLIN SODIUM-DEXTROSE 2-3 GM-% IV SOLR
2.0000 g | INTRAVENOUS | Status: AC
  Administered 2012-10-11: 2 g via INTRAVENOUS

## 2012-10-11 MED ORDER — PROPOFOL 10 MG/ML IV BOLUS
INTRAVENOUS | Status: DC | PRN
  Administered 2012-10-11: 240 mg via INTRAVENOUS

## 2012-10-11 MED ORDER — LIDOCAINE HCL (CARDIAC) 20 MG/ML IV SOLN
INTRAVENOUS | Status: DC | PRN
  Administered 2012-10-11: 30 mg via INTRAVENOUS

## 2012-10-11 MED ORDER — MIDAZOLAM HCL 5 MG/5ML IJ SOLN
INTRAMUSCULAR | Status: DC | PRN
  Administered 2012-10-11: 1 mg via INTRAVENOUS

## 2012-10-11 SURGICAL SUPPLY — 80 items
BANDAGE COBAN STERILE 2 (GAUZE/BANDAGES/DRESSINGS) IMPLANT
BANDAGE GAUZE ELAST BULKY 4 IN (GAUZE/BANDAGES/DRESSINGS) ×2 IMPLANT
BLADE CUDA 2.0 (BLADE) IMPLANT
BLADE EAR TYMPAN 2.5 60D BEAV (BLADE) IMPLANT
BLADE MINI RND TIP GREEN BEAV (BLADE) IMPLANT
BLADE SURG 15 STRL LF DISP TIS (BLADE) ×1 IMPLANT
BLADE SURG 15 STRL SS (BLADE) ×1
BNDG COHESIVE 1X5 TAN STRL LF (GAUZE/BANDAGES/DRESSINGS) IMPLANT
BNDG COHESIVE 3X5 TAN STRL LF (GAUZE/BANDAGES/DRESSINGS) ×2 IMPLANT
BNDG ESMARK 4X9 LF (GAUZE/BANDAGES/DRESSINGS) ×2 IMPLANT
BUR CUDA 2.9 (BURR) IMPLANT
BUR FULL RADIUS 2.0 (BURR) IMPLANT
BUR FULL RADIUS 2.9 (BURR) ×2 IMPLANT
BUR GATOR 2.9 (BURR) IMPLANT
BUR SPHERICAL 2.9 (BURR) ×2 IMPLANT
CANISTER OMNI JUG 16 LITER (MISCELLANEOUS) IMPLANT
CANISTER SUCTION 1200CC (MISCELLANEOUS) IMPLANT
CANISTER SUCTION 2500CC (MISCELLANEOUS) ×2 IMPLANT
CHLORAPREP W/TINT 26ML (MISCELLANEOUS) ×2 IMPLANT
CLOTH BEACON ORANGE TIMEOUT ST (SAFETY) ×2 IMPLANT
CORDS BIPOLAR (ELECTRODE) ×2 IMPLANT
COVER MAYO STAND STRL (DRAPES) ×4 IMPLANT
COVER TABLE BACK 60X90 (DRAPES) ×2 IMPLANT
CUFF TOURNIQUET SINGLE 18IN (TOURNIQUET CUFF) ×2 IMPLANT
DECANTER SPIKE VIAL GLASS SM (MISCELLANEOUS) IMPLANT
DRAIN PENROSE 1/2X12 LTX STRL (WOUND CARE) IMPLANT
DRAPE EXTREMITY T 121X128X90 (DRAPE) ×2 IMPLANT
DRAPE OEC MINIVIEW 54X84 (DRAPES) ×2 IMPLANT
DRAPE SURG 17X23 STRL (DRAPES) ×2 IMPLANT
DRSG KUZMA FLUFF (GAUZE/BANDAGES/DRESSINGS) IMPLANT
ELECT SMALL JOINT 90D BASC (ELECTRODE) IMPLANT
GAUZE XEROFORM 1X8 LF (GAUZE/BANDAGES/DRESSINGS) ×2 IMPLANT
GLOVE BIO SURGEON STRL SZ 6.5 (GLOVE) ×2 IMPLANT
GLOVE BIOGEL PI IND STRL 7.0 (GLOVE) ×1 IMPLANT
GLOVE BIOGEL PI IND STRL 8.5 (GLOVE) ×1 IMPLANT
GLOVE BIOGEL PI INDICATOR 7.0 (GLOVE) ×1
GLOVE BIOGEL PI INDICATOR 8.5 (GLOVE) ×1
GLOVE SURG ORTHO 8.0 STRL STRW (GLOVE) ×2 IMPLANT
GOWN BRE IMP PREV XXLGXLNG (GOWN DISPOSABLE) ×2 IMPLANT
GOWN PREVENTION PLUS XLARGE (GOWN DISPOSABLE) ×2 IMPLANT
NDL SAFETY ECLIPSE 18X1.5 (NEEDLE) ×1 IMPLANT
NEEDLE 27GAX1X1/2 (NEEDLE) IMPLANT
NEEDLE HYPO 18GX1.5 SHARP (NEEDLE) ×1
NEEDLE HYPO 22GX1.5 SAFETY (NEEDLE) ×2 IMPLANT
NEEDLE SPNL 18GX3.5 QUINCKE PK (NEEDLE) IMPLANT
NEEDLE TUOHY 20GX3.5 (NEEDLE) IMPLANT
NS IRRIG 1000ML POUR BTL (IV SOLUTION) ×2 IMPLANT
PACK BASIN DAY SURGERY FS (CUSTOM PROCEDURE TRAY) ×2 IMPLANT
PAD CAST 3X4 CTTN HI CHSV (CAST SUPPLIES) ×1 IMPLANT
PADDING CAST ABS 3INX4YD NS (CAST SUPPLIES) ×1
PADDING CAST ABS 4INX4YD NS (CAST SUPPLIES) ×1
PADDING CAST ABS COTTON 3X4 (CAST SUPPLIES) ×1 IMPLANT
PADDING CAST ABS COTTON 4X4 ST (CAST SUPPLIES) ×1 IMPLANT
PADDING CAST COTTON 3X4 STRL (CAST SUPPLIES) ×1
ROUTER HOODED VORTEX 2.9MM (BLADE) IMPLANT
SET ARTHROSCOPY TUBING (MISCELLANEOUS) ×1
SET ARTHROSCOPY TUBING LN (MISCELLANEOUS) ×1 IMPLANT
SET EXT MALE ROTATING LL 32IN (MISCELLANEOUS) ×2 IMPLANT
SET SM JOINT TUBING/CANN (CANNULA) IMPLANT
SLEEVE SCD COMPRESS KNEE MED (MISCELLANEOUS) ×2 IMPLANT
SPLINT PLASTER CAST XFAST 3X15 (CAST SUPPLIES) IMPLANT
SPLINT PLASTER XTRA FASTSET 3X (CAST SUPPLIES)
SPONGE GAUZE 4X4 12PLY (GAUZE/BANDAGES/DRESSINGS) ×2 IMPLANT
STOCKINETTE 4X48 STRL (DRAPES) ×2 IMPLANT
SUCTION FRAZIER TIP 10 FR DISP (SUCTIONS) IMPLANT
SUT MERSILENE 4 0 P 3 (SUTURE) IMPLANT
SUT PDS AB 2-0 CT2 27 (SUTURE) IMPLANT
SUT STEEL 4 0 (SUTURE) IMPLANT
SUT VIC AB 2-0 PS2 27 (SUTURE) IMPLANT
SUT VIC AB 4-0 P2 18 (SUTURE) IMPLANT
SUT VICRYL 4-0 PS2 18IN ABS (SUTURE) ×2 IMPLANT
SUT VICRYL RAPID 5 0 P 3 (SUTURE) IMPLANT
SUT VICRYL RAPIDE 4/0 PS 2 (SUTURE) ×2 IMPLANT
SYR BULB 3OZ (MISCELLANEOUS) ×2 IMPLANT
SYR CONTROL 10ML LL (SYRINGE) ×2 IMPLANT
TOWEL OR 17X24 6PK STRL BLUE (TOWEL DISPOSABLE) ×4 IMPLANT
TUBE CONNECTING 20X1/4 (TUBING) ×2 IMPLANT
UNDERPAD 30X30 INCONTINENT (UNDERPADS AND DIAPERS) ×2 IMPLANT
WAND 1.5 MICROBLATOR (SURGICAL WAND) ×2 IMPLANT
WATER STERILE IRR 1000ML POUR (IV SOLUTION) IMPLANT

## 2012-10-11 NOTE — Anesthesia Postprocedure Evaluation (Signed)
  Anesthesia Post-op Note  Patient: Gregory Harvey  Procedure(s) Performed: Procedure(s): ARTHROSCOPY RIGHT WRIST POSSIBLE SHRINKAGE DEBRIDEMENT FELDON DOME (Right) OPEN EXCISION CYST WITH RECONSTRUCTION DORSAL CAPSULE (Right)  Patient Location: PACU  Anesthesia Type:GA combined with regional for post-op pain  Level of Consciousness: awake and alert   Airway and Oxygen Therapy: Patient Spontanous Breathing  Post-op Pain: none  Post-op Assessment: Post-op Vital signs reviewed, Patient's Cardiovascular Status Stable, Respiratory Function Stable, Patent Airway, No signs of Nausea or vomiting and Pain level controlled  Post-op Vital Signs: stable  Complications: No apparent anesthesia complications

## 2012-10-11 NOTE — Brief Op Note (Signed)
10/11/2012  12:45 PM  PATIENT:  Gregory Harvey  47 y.o. 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, and Ulnar Carpal Abuttment Right Wrist  PROCEDURE:  Procedure(s): ARTHROSCOPY RIGHT WRIST POSSIBLE SHRINKAGE DEBRIDEMENT FELDON DOME (Right) OPEN EXCISION CYST WITH RECONSTRUCTION DORSAL CAPSULE (Right)  SURGEON:  Surgeon(s) and Role:    * Nicki Reaper, MD - Primary  PHYSICIAN ASSISTANT:   ASSISTANTS: none   ANESTHESIA:   regional and general  EBL:  Total I/O In: 1000 [I.V.:1000] Out: -   BLOOD ADMINISTERED:none  DRAINS: none   LOCAL MEDICATIONS USED:  NONE  SPECIMEN:  Excision  DISPOSITION OF SPECIMEN:  PATHOLOGY  COUNTS:  YES  TOURNIQUET:   Total Tourniquet Time Documented: Upper Arm (Right) - 33 minutes Total: Upper Arm (Right) - 33 minutes   DICTATION: .Other Dictation: Dictation Number [id number]  PLAN OF CARE: Discharge to home after PACU  PATIENT DISPOSITION:  PACU - hemodynamically stable.   D

## 2012-10-11 NOTE — Transfer of Care (Signed)
Immediate Anesthesia Transfer of Care Note  Patient: Gregory Harvey  Procedure(s) Performed: Procedure(s): ARTHROSCOPY RIGHT WRIST POSSIBLE SHRINKAGE DEBRIDEMENT FELDON DOME (Right) OPEN EXCISION CYST WITH RECONSTRUCTION DORSAL CAPSULE (Right)  Patient Location: PACU  Anesthesia Type:GA combined with regional for post-op pain  Level of Consciousness: awake and patient cooperative  Airway & Oxygen Therapy: Patient Spontanous Breathing and Patient connected to face mask oxygen  Post-op Assessment: Report given to PACU RN and Post -op Vital signs reviewed and stable  Post vital signs: Reviewed and stable  Complications: No apparent anesthesia complications

## 2012-10-11 NOTE — Op Note (Signed)
Dictation Number [id number]

## 2012-10-11 NOTE — Progress Notes (Signed)
Assisted Dr. Kasik with right, ultrasound guided, supraclavicular block. Side rails up, monitors on throughout procedure. See vital signs in flow sheet. Tolerated Procedure well. 

## 2012-10-11 NOTE — Anesthesia Preprocedure Evaluation (Signed)
Anesthesia Evaluation  Patient identified by MRN, date of birth, ID band Patient awake    Reviewed: Allergy & Precautions, H&P , NPO status , Patient's Chart, lab work & pertinent test results  Airway Mallampati: I TM Distance: >3 FB Neck ROM: Full    Dental   Pulmonary  breath sounds clear to auscultation        Cardiovascular hypertension, Rhythm:Regular Rate:Normal     Neuro/Psych    GI/Hepatic   Endo/Other    Renal/GU      Musculoskeletal   Abdominal   Peds  Hematology   Anesthesia Other Findings   Reproductive/Obstetrics                           Anesthesia Physical Anesthesia Plan  ASA: II  Anesthesia Plan: General   Post-op Pain Management:    Induction: Intravenous  Airway Management Planned: LMA  Additional Equipment:   Intra-op Plan:   Post-operative Plan: Extubation in OR  Informed Consent: I have reviewed the patients History and Physical, chart, labs and discussed the procedure including the risks, benefits and alternatives for the proposed anesthesia with the patient or authorized representative who has indicated his/her understanding and acceptance.     Plan Discussed with: CRNA and Surgeon  Anesthesia Plan Comments:         Anesthesia Quick Evaluation  

## 2012-10-11 NOTE — Anesthesia Procedure Notes (Addendum)
Anesthesia Regional Block:  Supraclavicular block  Pre-Anesthetic Checklist: ,, timeout performed, Correct Patient, Correct Site, Correct Laterality, Correct Procedure, Correct Position, site marked, Risks and benefits discussed,  Surgical consent,  Pre-op evaluation,  At surgeon's request and post-op pain management  Laterality: Right     Needles:  Injection technique: Single-shot  Needle Type: Echogenic Stimulator Needle     Needle Length: 5cm 5 cm Needle Gauge: 22 and 22 G    Additional Needles:  Procedures: ultrasound guided (picture in chart) and nerve stimulator Supraclavicular block  Nerve Stimulator or Paresthesia:  Response: 0.48 mA,   Additional Responses:   Narrative:  Start time: 10/11/2012 9:52 AM End time: 10/11/2012 10:02 AM Injection made incrementally with aspirations every 3 mL. Anesthesiologist: Dr Gypsy Balsam  Additional Notes: [id number] R Supraclav Nerve Block POP CHG prep, sterile tech #22 stim/echo needle with stim down to .48ma and good [place] visualization-PIX in chart Marc .5% w/epi 1:200000 25cc+decadron 4mg  infiltrated No compl Dr Gypsy Balsam     Procedure Name: LMA Insertion Date/Time: 10/11/2012 10:55 AM Performed by: Luverne Zerkle D Pre-anesthesia Checklist: Patient identified, Emergency Drugs available, Suction available and Patient being monitored Patient Re-evaluated:Patient Re-evaluated prior to inductionOxygen Delivery Method: Circle System Utilized Preoxygenation: Pre-oxygenation with 100% oxygen Intubation Type: IV induction Ventilation: Mask ventilation without difficulty LMA: LMA inserted LMA Size: 5.0 Number of attempts: 1 Placement Confirmation: positive ETCO2 Tube secured with: Tape Dental Injury: Teeth and Oropharynx as per pre-operative assessment

## 2012-10-11 NOTE — H&P (Signed)
Gregory Harvey is a 47 year old right hand dominant male referred with pain in his right wrist. He states this began 2 years ago after a fall. 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 He was given a Cortisone shot on 06-06-12 which increased his discomfort and did not relieve his pain. He has been taking ibuprofen. He took Hydrocodone for a short period of time following the injection. He complains of an intermittent moderate dull stabbing pain with activity and exercise. No history of diabetes, thyroid problems, arthritis or gout. There is a family history of diabetes. He has had his MRI done and this reveals that he has a TFCC tear that the does not have lunotriquetral coalition, that he may have an ulnocarpal abutment.  He does have changes in multiple bones with cystic changes. He does have TFCC tear.  He does have dorsal wrist ganglion.  He continues to complain of pain. His lab work was entirely normal. His circulation is intact. He does have pain with ulnar deviation rotation but is complaining primarily of the dorsal aspect where the cyst is. This is in the scapholunate ligament area.   PAST MEDICAL HISTORY: He has no known drug allergies. He is on Lisinopril and HCTZ. He had removal of a cyst as a child from the dorsal aspect right wrist.   FAMILY H ISTORY: Positive for diabetes, high BP and arthritis.  SOCIAL HISTORY: He does not smoke. He drinks socially. He is married. Gregory Harvey is an 47 y.o. male.   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 Seasonal allergies   . Hypertension     under control with med., has been on med. x 2 yr.  . 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 Knee arthroscopy Right   . Orchiopexy    . Cyst excision Right     wrist    History reviewed. No pertinent family history. Social History:  reports that he has never smoked. He has never used smokeless tobacco. He reports that he does not drink alcohol or use illicit drugs.  Allergies: No Known Allergies  Medications Prior to Admission  Medication Sig Dispense Refill  . albuterol (PROVENTIL HFA;VENTOLIN HFA) 108 (90 BASE) MCG/ACT inhaler Inhale 2 puffs into the lungs every 6 (six) hours as needed. For shortness of breath      . fexofenadine (ALLEGRA) 180 MG tablet Take 180 mg by mouth daily.      Marland Kitchen lisinopril-hydrochlorothiazide (PRINZIDE,ZESTORETIC) 20-12.5 MG per tablet Take 1 tablet by mouth daily.      . sildenafil (VIAGRA) 100 MG tablet Take 100 mg by mouth daily as needed for erectile dysfunction.      Marland Kitchen loratadine (CLARITIN) 10 MG tablet Take 1 tablet (10 mg total) by mouth daily.  30 tablet  0    Results for orders placed during the hospital encounter of 10/11/12 (from the past 48 hour(s))  BASIC METABOLIC PANEL     Status: Abnormal   Collection Time    10/10/12  9:30 AM      Result Value Range   Sodium 138  135 - 145 mEq/L  Potassium 4.0  3.5 - 5.1 mEq/L   Chloride 101  96 - 112 mEq/L   CO2 28  19 - 32 mEq/L   Glucose, Bld 105 (*) 70 - 99 mg/dL   BUN 16  6 - 23 mg/dL   Creatinine, Ser 8.11  0.50 - 1.35 mg/dL   Calcium 9.4  8.4 - 91.4 mg/dL   GFR calc non Af Amer 76 (*) >90 mL/min   GFR calc Af Amer 88 (*) >90 mL/min   Comment:            The eGFR has been calculated     using the CKD EPI equation.     This calculation has not been     validated in all clinical     situations.     eGFR's persistently     <90 mL/min signify     possible Chronic Kidney Disease.    No results found.   Pertinent items are noted in HPI.  Blood pressure 133/94, pulse 70, temperature 98.6 F (37 C), temperature source Oral, height 6\' 1"  (1.854 m), weight 92.08  kg (203 lb), SpO2 100.00%.  General appearance: alert, cooperative and appears stated age Head: Normocephalic, without obvious abnormality Neck: no JVD Resp: clear to auscultation bilaterally Cardio: regular rate and rhythm, S1, S2 normal, no murmur, click, rub or gallop GI: soft, non-tender; bowel sounds normal; no masses,  no organomegaly Extremities: extremities normal, atraumatic, no cyanosis or edema Pulses: 2+ and symmetric Skin: Skin color, texture, turgor normal. No rashes or lesions Neurologic: Grossly normal Incision/Wound: na  Assessment/Plan We have discussed the possibility of arthroscopic inspection, ulnar shortening osteotomy. He does not want to have an ulnar shortening osteotomy done at the present time. He would like to have the cyst excised. We would recommend reconstruction of the dorsal capsule after arthroscopic inspection. He would like to proceed. The pre, peri and post op course are discussed along with risks and complications.  He is aware there is no guarantee with surgery, possibility of infection, recurrence, injury to arteries, nerves and tendons, incomplete relief of symptoms and dystrophy.  He would like to try a Feldon dome arthroplasty rather than ulnar shortening. This is scheduled for arthroscopy of his right wrist with possible debridement Feldon arthroplasty open reconstruction of dorsal capsule, excision of dorsal cyst right wrist as an outpatient under regional anesthesia.   Eboney Claybrook R 10/11/2012, 9:39 AM

## 2012-10-12 ENCOUNTER — Encounter (HOSPITAL_BASED_OUTPATIENT_CLINIC_OR_DEPARTMENT_OTHER): Payer: Self-pay | Admitting: Orthopedic Surgery

## 2012-10-12 NOTE — Op Note (Signed)
NAME:  Gregory Harvey, Gregory Harvey NO.:  [id number]  MEDICAL RECORD NO.:  [id number]  LOCATION:                                 FACILITY:  PHYSICIAN:  Cindee Salt, M.D.       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  OPERATION:  Arthroscopy with __________ arthroplasty and debridement of triangular fibrocartilage complex, right wrist open excision of cyst with dorsal capsular repair using extensor retinaculum, right wrist.  SURGEON:  Cindee Salt, M.D.  ANESTHESIA:  Supraclavicular block, general.  ANESTHESIOLOGIST:  Bedelia Person, M.D.  HISTORY:  The patient is a 47 year old male with a history of dorsal wrist pain ulnar-sided wrist pain, and recurrent cyst on MRI.  The MRI also reveals a TFCC tear with changes in the ulnar aspect of his lunate. He does have a champagne glass deformity to his lunotriquetral joints indicative of a probable partial collision.  He is admitted now for arthroscopic debridement, possible shrinkage, debridement of TFCC with __________ arthroplasty in that he has rejected having an ulnar shortening osteotomy with opened reconstruction of dorsal capsule, excision of dorsal cyst, right wrist.  He is aware that there is no guarantee with surgery; possibility of infection; recurrence of injury to arteries, nerves, tendons, incomplete relief of symptoms, dystrophy. In the preoperative area, the patient is seen.  The extremity was marked by both patient and the surgeon.  Antibiotic  given.  DETAILS OF PROCEDURE:  The patient was brought to the operating room where a general anesthetic was carried out without difficulty after a supraclavicular block was given.  He was prepped using ChloraPrep in supine position with the right arm free.  The limb was placed in the arthroscopy tower, 10 pounds traction applied.  The joint was inflated 3- 4 portal.  Transverse incision made, deepened with a hemostat.  Blunt trocar was used to enter the joint.  Due to the old scarring from his previous surgery, some difficulty was encountered in opening portals on both mid proximal and midcarpal joint.  The joint, however, was opened, inspected.  This scapholunate ligament showed some redundancy, but no discrete tear.  Volar radial wrist ligaments were intact. 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  A central tear of the TFCC was present.  A moderate synovitis was present at the ulnar side of his wrist.  Irrigation catheter was placed in __________ portal was then opened after localization with a 22-gauge needle.  A full radius shaver was inserted along with Arthro wand and a debridement of the TFCC was performed.  The ulnar head was immediately visible. Chondromalacia changes were present.  The midcarpal joint was then inspected with some difficulty through the radial midcarpal portal. This revealed no instability of the scapholunate.  The lunotriquetral joint had moved.  There were no changes on the proximal capitate,  proximal hamate.  The STT showed no changes.  The scope was reinserted in the 3-4 portal.  Inspection performed from the ulnar-sided.  No discrete tear of the lunotriquetral joint was noted.  A __________ arthroplasty was then performed using a full radius shaver and bur. This was done leaving the area of dome on the AP radiographs.  The instruments were removed following the arthroscopy.  The limb was then removed from the arthroscopy tower, 10  pounds of traction removed.  The limb was exsanguinated with an Esmarch bandage.  Tourniquet was placed high on the arm and was inflated to 250 mmHg.  A transverse incision was made using the old incision, carried down through subcutaneous tissue. Fourth dorsal compartment immediately showed a large cyst.  This was isolated and debrided and was found to have a large defect present in the dorsal capsule.  The extensor retinaculum was then harvested over to the level of the sixth dorsal compartment, taking care to protect dorsal neurovascular structures.  The wound was copiously irrigated with saline after the debridement of the area with a rongeur.  This system came from the scapholunate ligament complex area.  The dorsal retinaculum was then inserted over this and sutured into position with figure-of-eight 4-0 Vicryl sutures.  The extensor tendons covered the area of defect.  The wound was again irrigated.  The subcutaneous tissue was closed with interrupted 4-0 Vicryl, and the skin with interrupted 4-0 Vicryl Rapide. A sterile compressive dressing and volar splint was applied.  On deflation of the tourniquet, all fingers immediately pinked.  He was taken to the recovery room for observation in satisfactory condition. He will be discharged home to return to [facility] in 1 week on Percocet.          ______________________________ Cindee Salt, M.D.     GK/MEDQ  D:  10/11/2012  T:  10/12/2012  Job:  [id number]

## 2013-06-22 ENCOUNTER — Emergency Department (HOSPITAL_BASED_OUTPATIENT_CLINIC_OR_DEPARTMENT_OTHER)
Admission: EM | Admit: 2013-06-22 | Discharge: 2013-06-22 | Disposition: A | Discharge status: Home / Self Care | Payer: Managed Care, Other (non HMO) | Attending: Emergency Medicine | Admitting: Emergency Medicine

## 2013-06-22 ENCOUNTER — Encounter (HOSPITAL_BASED_OUTPATIENT_CLINIC_OR_DEPARTMENT_OTHER): Payer: Self-pay | Admitting: Emergency Medicine

## 2013-06-22 DIAGNOSIS — Z87828 Personal history of other (healed) physical injury and trauma: Secondary | ICD-10-CM | POA: Insufficient documentation

## 2013-06-22 DIAGNOSIS — J111 Influenza due to unidentified influenza virus with other respiratory manifestations: Secondary | ICD-10-CM | POA: Insufficient documentation

## 2013-06-22 DIAGNOSIS — I10 Essential (primary) hypertension: Secondary | ICD-10-CM | POA: Insufficient documentation

## 2013-06-22 DIAGNOSIS — Z79899 Other long term (current) drug therapy: Secondary | ICD-10-CM | POA: Insufficient documentation

## 2013-06-22 DIAGNOSIS — Z8739 Personal history of other diseases of the musculoskeletal system and connective tissue: Secondary | ICD-10-CM | POA: Insufficient documentation

## 2013-06-22 MED ORDER — BENZONATATE 200 MG PO CAPS: 200.0000 mg | ORAL_CAPSULE | Freq: Three times a day (TID) | ORAL | Status: DC | PRN

## 2013-06-22 MED ORDER — IBUPROFEN 400 MG PO TABS
600.0000 mg | ORAL_TABLET | Freq: Once | ORAL | Status: AC
  Administered 2013-06-22: 600 mg via ORAL
  Filled 2013-06-22 (×2): qty 1

## 2013-06-22 NOTE — ED Notes (Signed)
Cough, fever, body aches x2 days

## 2013-06-22 NOTE — ED Notes (Signed)
NP at bedside.

## 2013-06-22 NOTE — ED Provider Notes (Signed)
CSN: [id number]     Arrival date & time 06/22/13  1925 History   First MD Initiated Contact with Patient 06/22/13 2008     Chief Complaint  Patient presents with  . Flu-like Symptoms    (Consider location/radiation/quality/duration/timing/severity/associated sxs/prior Treatment) Patient is a 48 y.o. male presenting with cough. The history is provided by the patient. No language interpreter was used.  Cough Cough characteristics:  Non-productive Severity:  Moderate Duration:  2 days Timing:  Intermittent Context: sick contacts   Context: not smoke exposure   Associated symptoms: fever   Associated symptoms: no chest pain, no chills, no rash, no shortness of breath and no wheezing   Fever:    Duration:  2 days Pt is a 48 year old male who presents with cough, fever and body aches for two days. His wife and 3 children have been sick with a similar illness. He reports that he has has a non-productive cough and fever with generalized body aches. He says he feels like he has the flu and feels horrible with body aches and fatigue. He denies chest pain, shortness of breath or difficulty breathing. No N/V/D. No recent travel.   Past Medical History  Diagnosis Date  . Seasonal allergies   . Hypertension     under control with med., has been on med. x 2 yr.  . 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 TFCC (triangular fibrocartilage complex) tear 09/2012    right   Past Surgical History  Procedure Laterality Date  . Knee arthroscopy Right   . Orchiopexy    . Cyst excision Right     wrist  . Wrist arthroscopy Right 10/11/2012    Procedure: ARTHROSCOPY RIGHT WRIST POSSIBLE SHRINKAGE DEBRIDEMENT FELDON DOME;  Surgeon: Nicki Reaper, MD;  Location: [facility];  Service: Orthopedics;  Laterality: Right;   No family history on file. History  Substance Use Topics  . Smoking status: Never Smoker   . Smokeless tobacco: Never Used  . Alcohol Use: No    Review  of Systems  Constitutional: Positive for fever. Negative for chills.  Respiratory: Positive for cough. Negative for shortness of breath and wheezing.   Cardiovascular: Negative for chest pain.  Gastrointestinal: Negative for nausea, vomiting, abdominal pain, diarrhea and constipation.  Musculoskeletal: Positive for arthralgias.  Skin: Negative for rash.  All other systems reviewed and are negative.    Allergies  Review of patient's allergies indicates no known allergies.  Home Medications   Current Outpatient Rx  Name  Route  Sig  Dispense  Refill  . albuterol (PROVENTIL HFA;VENTOLIN HFA) 108 (90 BASE) MCG/ACT inhaler   Inhalation   Inhale 2 puffs into the lungs every 6 (six) hours as needed. For shortness of breath         . fexofenadine (ALLEGRA) 180 MG tablet   Oral   Take 180 mg by mouth daily.         Marland Kitchen lisinopril-hydrochlorothiazide (PRINZIDE,ZESTORETIC) 20-12.5 MG per tablet   Oral   Take 1 tablet by mouth daily.         Marland Kitchen EXPIRED: loratadine (CLARITIN) 10 MG tablet   Oral   Take 1 tablet (10 mg total) by mouth daily.   30 tablet   0   . oxyCODONE-acetaminophen (PERCOCET) 7.5-325 MG per tablet   Oral   Take 1 tablet by mouth every 4 (four) hours as needed for pain.   30 tablet  0   . sildenafil (VIAGRA) 100 MG tablet   Oral   Take 100 mg by mouth daily as needed for erectile dysfunction.          BP 118/86  Pulse 105  Temp(Src) 99.3 F (37.4 C) (Oral)  Resp 16  Ht 6\' 1"  (1.854 m)  Wt 200 lb (90.719 kg)  BMI 26.39 kg/m2  SpO2 98% Physical Exam  Nursing note and vitals reviewed. Constitutional: He is oriented to person, place, and time. He appears well-developed and well-nourished. No distress.  Ill-appearing  HENT:  Head: Normocephalic and atraumatic.  Mild oropharyngeal edema and erythema. No exudates.  Eyes: Conjunctivae and EOM are normal. Right eye exhibits no discharge.  Neck: Normal range of motion. Neck supple. No JVD present. No  tracheal deviation present. No thyromegaly present.  Cardiovascular: Normal rate, regular rhythm, normal heart sounds and intact distal pulses.   Pulmonary/Chest: Effort normal and breath sounds normal.  Abdominal: Soft. Bowel sounds are normal. He exhibits no distension. There is no tenderness.  Musculoskeletal: Normal range of motion.  Neurological: He is alert and oriented to person, place, and time.  Skin: Skin is warm and dry.  Psychiatric: He has a normal mood and affect. His behavior is normal. Judgment and thought content normal.    ED Course  Procedures (including critical care time) Labs Review Labs Reviewed - No data to display Imaging Review No results found.  EKG Interpretation   None       MDM   1. Influenza    Family sick with similar illness. Body aches with cough and fever. Exam and history consistent with the flu. No chest pain, shortness of breath or difficulty breathing. Discussed plan to include ibuprofen or tylenol for body aches, increase oral fluids and rest. Care aimed at symptomatic treatment. Return precautions given and pt agrees to plan of care. Tessalon prescribed for cough to use as needed.      Irish EldersKelly Crickett Abbett, NP 06/28/13 1402

## 2013-06-22 NOTE — Discharge Instructions (Signed)
Influenza, Adult Influenza ("the flu") is a viral infection of the respiratory tract. It occurs more often in winter months because people spend more time in close contact with one another. Influenza can make you feel very sick. Influenza easily spreads from person to person (contagious). CAUSES  Influenza is caused by a virus that infects the respiratory tract. You can catch the virus by breathing in droplets from an infected person's cough or sneeze. You can also catch the virus by touching something that was recently contaminated with the virus and then touching your mouth, nose, or eyes. SYMPTOMS  Symptoms typically last 4 to 10 days and may include:  Fever.  Chills.  Headache, body aches, and muscle aches.  Sore throat.  Chest discomfort and cough.  Poor appetite.  Weakness or feeling tired.  Dizziness.  Nausea or vomiting. DIAGNOSIS  Diagnosis of influenza is often made based on your history and a physical exam. A nose or throat swab test can be done to confirm the diagnosis. RISKS AND COMPLICATIONS You may be at risk for a more severe case of influenza if you smoke cigarettes, have diabetes, have chronic heart disease (such as heart failure) or lung disease (such as asthma), or if you have a weakened immune system. Elderly people and pregnant women are also at risk for more serious infections. The most common complication of influenza is a lung infection (pneumonia). Sometimes, this complication can require emergency medical care and may be life-threatening. PREVENTION  An annual influenza vaccination (flu shot) is the best way to avoid getting influenza. An annual flu shot is now routinely recommended for all adults in the [place] TREATMENT  In mild cases, influenza goes away on its own. Treatment is directed at relieving symptoms. For more severe cases, your caregiver may prescribe antiviral medicines to shorten the sickness. Antibiotic medicines are not effective, because the  infection is caused by a virus, not by bacteria. HOME CARE INSTRUCTIONS  Only take over-the-counter or prescription medicines for pain, discomfort, or fever as directed by your caregiver.  Use a cool mist humidifier to make breathing easier.  Get plenty of rest until your temperature returns to normal. This usually takes 3 to 4 days.  Drink enough fluids to keep your urine clear or pale yellow.  Cover your mouth and nose when coughing or sneezing, and wash your hands well to avoid spreading the virus.  Stay home from work or school until your fever has been gone for at least 1 full day. SEEK MEDICAL CARE IF:   You have chest pain or a deep cough that worsens or produces more mucus.  You have nausea, vomiting, or diarrhea. SEEK IMMEDIATE MEDICAL CARE IF:   You have difficulty breathing, shortness of breath, or your skin or nails turn bluish.  You have severe neck pain or stiffness.  You have a severe headache, facial pain, or earache.  You have a worsening or recurring fever.  You have nausea or vomiting that cannot be controlled. MAKE SURE YOU:  Understand these instructions.  Will watch your condition.  Will get help right away if you are not doing well or get worse. Document Released: 04/30/2000 Document Revised: 11/02/2011 Document Reviewed: 08/02/2011 [facility] Center[facility] Patient Information 2014 [place], [place].   Tessalon as prescribed or robitussin DM over the counter for cough Ibuprofen for discomfort Drink plenty of fluids Return for chest pain, shortness of breath or worsening of symptoms

## 2013-06-28 NOTE — ED Provider Notes (Signed)
Medical screening examination/treatment/procedure(s) were performed by non-physician practitioner and as supervising physician I was immediately available for consultation/collaboration.  Megan E Docherty, MD 06/28/13 1556 

## 2013-08-03 ENCOUNTER — Encounter (HOSPITAL_BASED_OUTPATIENT_CLINIC_OR_DEPARTMENT_OTHER): Payer: Self-pay | Admitting: Emergency Medicine

## 2013-08-03 DIAGNOSIS — M545 Low back pain, unspecified: Secondary | ICD-10-CM | POA: Insufficient documentation

## 2013-08-03 DIAGNOSIS — Z8709 Personal history of other diseases of the respiratory system: Secondary | ICD-10-CM | POA: Insufficient documentation

## 2013-08-03 DIAGNOSIS — Z87828 Personal history of other (healed) physical injury and trauma: Secondary | ICD-10-CM | POA: Insufficient documentation

## 2013-08-03 DIAGNOSIS — Z791 Long term (current) use of non-steroidal anti-inflammatories (NSAID): Secondary | ICD-10-CM | POA: Insufficient documentation

## 2013-08-03 DIAGNOSIS — R52 Pain, unspecified: Secondary | ICD-10-CM | POA: Insufficient documentation

## 2013-08-03 DIAGNOSIS — Z79899 Other long term (current) drug therapy: Secondary | ICD-10-CM | POA: Insufficient documentation

## 2013-08-03 DIAGNOSIS — I10 Essential (primary) hypertension: Secondary | ICD-10-CM | POA: Insufficient documentation

## 2013-08-03 LAB — URINALYSIS, ROUTINE W REFLEX MICROSCOPIC
Bilirubin Urine: NEGATIVE
GLUCOSE, UA: NEGATIVE mg/dL
HGB URINE DIPSTICK: NEGATIVE
Ketones, ur: 15 mg/dL — AB
LEUKOCYTES UA: NEGATIVE
Nitrite: NEGATIVE
PH: 6 (ref 5.0–8.0)
Protein, ur: NEGATIVE mg/dL
SPECIFIC GRAVITY, URINE: 1.028 (ref 1.005–1.030)
Urobilinogen, UA: 1 mg/dL (ref 0.0–1.0)

## 2013-08-03 NOTE — ED Notes (Signed)
Pt states that he woke up a couple of days ago with back pain. Pt states no known reason for pain such as lifting heavy objects or twisted. Pt states right side of back does not cross to his abdomen.

## 2013-08-04 ENCOUNTER — Emergency Department (HOSPITAL_BASED_OUTPATIENT_CLINIC_OR_DEPARTMENT_OTHER)
Admission: EM | Admit: 2013-08-04 | Discharge: 2013-08-04 | Disposition: A | Discharge status: Home / Self Care | Payer: Managed Care, Other (non HMO) | Attending: Emergency Medicine | Admitting: Emergency Medicine

## 2013-08-04 DIAGNOSIS — M549 Dorsalgia, unspecified: Secondary | ICD-10-CM

## 2013-08-04 MED ORDER — CYCLOBENZAPRINE HCL 10 MG PO TABS: 10.0000 mg | ORAL_TABLET | Freq: Two times a day (BID) | ORAL | Status: DC | PRN

## 2013-08-04 MED ORDER — HYDROMORPHONE HCL PF 2 MG/ML IJ SOLN
2.0000 mg | Freq: Once | INTRAMUSCULAR | Status: AC
  Administered 2013-08-04: 2 mg via INTRAMUSCULAR
  Filled 2013-08-04: qty 1

## 2013-08-04 MED ORDER — HYDROCODONE-ACETAMINOPHEN 5-325 MG PO TABS: 1.0000 | ORAL_TABLET | Freq: Four times a day (QID) | ORAL | Status: DC | PRN

## 2013-08-04 MED ORDER — NAPROXEN 500 MG PO TABS: 500.0000 mg | ORAL_TABLET | Freq: Two times a day (BID) | ORAL | Status: DC

## 2013-08-04 MED ORDER — NAPROXEN 250 MG PO TABS
500.0000 mg | ORAL_TABLET | Freq: Once | ORAL | Status: AC
  Administered 2013-08-04: 500 mg via ORAL
  Filled 2013-08-04: qty 2

## 2013-08-04 NOTE — ED Provider Notes (Signed)
CSN: [id number]     Arrival date & time 08/03/13  2301 History   First MD Initiated Contact with Patient 08/04/13 0115     Chief Complaint  Patient presents with  . Back Pain     (Consider location/radiation/quality/duration/timing/severity/associated sxs/prior Treatment) Patient is a 48 y.o. male presenting with back pain. The history is provided by the patient.  Back Pain Associated symptoms: no abdominal pain, no chest pain, no dysuria, no fever, no headaches, no numbness and no weakness    patient with acute onset of back pain 2 days ago. His right lumbar area nonradiating pain is 6-7/10. Sharp ache in nature. No numbness or weakness to the leg on the right side. No bowel pain. No fevers no dysuria. No history of injury. Just awoke with the back pain. Patient been taking Flexeril at home. No history of similar problem. Pain is made worse by movement.  Past Medical History  Diagnosis Date  . Seasonal allergies   . Hypertension     under control with med., has been on med. x 2 yr.  . 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 TFCC (triangular fibrocartilage complex) tear 09/2012    right   Past Surgical History  Procedure Laterality Date  . Knee arthroscopy Right   . Orchiopexy    . Cyst excision Right     wrist  . Wrist arthroscopy Right 10/11/2012    Procedure: ARTHROSCOPY RIGHT WRIST POSSIBLE SHRINKAGE DEBRIDEMENT FELDON DOME;  Surgeon: Nicki Reaper, MD;  Location: [facility];  Service: Orthopedics;  Laterality: Right;   History reviewed. No pertinent family history. History  Substance Use Topics  . Smoking status: Never Smoker   . Smokeless tobacco: Never Used  . Alcohol Use: No    Review of Systems  Constitutional: Negative for fever.  HENT: Negative for congestion.   Eyes: Negative for redness.  Respiratory: Negative for shortness of breath.   Cardiovascular: Negative for chest pain.  Gastrointestinal: Negative for abdominal pain.   Genitourinary: Negative for dysuria.  Musculoskeletal: Positive for back pain. Negative for neck pain.  Neurological: Negative for weakness, numbness and headaches.  Hematological: Does not bruise/bleed easily.  Psychiatric/Behavioral: Negative for confusion.      Allergies  Review of patient's allergies indicates no known allergies.  Home Medications   Current Outpatient Rx  Name  Route  Sig  Dispense  Refill  . albuterol (PROVENTIL HFA;VENTOLIN HFA) 108 (90 BASE) MCG/ACT inhaler   Inhalation   Inhale 2 puffs into the lungs every 6 (six) hours as needed. For shortness of breath         . benzonatate (TESSALON) 200 MG capsule   Oral   Take 1 capsule (200 mg total) by mouth 3 (three) times daily as needed for cough.   20 capsule   0   . cyclobenzaprine (FLEXERIL) 10 MG tablet   Oral   Take 1 tablet (10 mg total) by mouth 2 (two) times daily as needed for muscle spasms.   20 tablet   0   . fexofenadine (ALLEGRA) 180 MG tablet   Oral   Take 180 mg by mouth daily.         Marland Kitchen HYDROcodone-acetaminophen (NORCO/VICODIN) 5-325 MG per tablet   Oral   Take 1-2 tablets by mouth every 6 (six) hours as needed for moderate pain.   20 tablet   0   . lisinopril-hydrochlorothiazide (PRINZIDE,ZESTORETIC) 20-12.5 MG per tablet  Oral   Take 1 tablet by mouth daily.         Marland Kitchen. EXPIRED: loratadine (CLARITIN) 10 MG tablet   Oral   Take 1 tablet (10 mg total) by mouth daily.   30 tablet   0   . naproxen (NAPROSYN) 500 MG tablet   Oral   Take 1 tablet (500 mg total) by mouth 2 (two) times daily.   14 tablet   0   . oxyCODONE-acetaminophen (PERCOCET) 7.5-325 MG per tablet   Oral   Take 1 tablet by mouth every 4 (four) hours as needed for pain.   30 tablet   0   . sildenafil (VIAGRA) 100 MG tablet   Oral   Take 100 mg by mouth daily as needed for erectile dysfunction.          BP 153/103  Pulse 76  Temp(Src) 97.8 F (36.6 C) (Oral)  Resp 20  Ht 6\' 1"  (1.854 m)   Wt 200 lb (90.719 kg)  BMI 26.39 kg/m2  SpO2 100% Physical Exam  Nursing note and vitals reviewed. Constitutional: He is oriented to person, place, and time. He appears well-developed and well-nourished. No distress.  HENT:  Head: Normocephalic and atraumatic.  Eyes: Conjunctivae and EOM are normal. Pupils are equal, round, and reactive to light.  Neck: Normal range of motion.  Cardiovascular: Normal rate, regular rhythm and normal heart sounds.   No murmur heard. Pulmonary/Chest: Effort normal and breath sounds normal. No respiratory distress.  Abdominal: Soft. Bowel sounds are normal. There is no tenderness.  Musculoskeletal: He exhibits tenderness.  Mild tenderness to palpation to the right lumbar area. No evidence of muscle spasm.  Neurological: He is alert and oriented to person, place, and time. No cranial nerve deficit. He exhibits normal muscle tone. Coordination normal.  Skin: Skin is warm.    ED Course  Procedures (including critical care time) Labs Review Labs Reviewed  URINALYSIS, ROUTINE W REFLEX MICROSCOPIC - Abnormal; Notable for the following:    Ketones, ur 15 (*)    All other components within normal limits   Results for orders placed during the hospital encounter of 08/04/13  URINALYSIS, ROUTINE W REFLEX MICROSCOPIC      Result Value Ref Range   Color, Urine YELLOW  YELLOW   APPearance CLEAR  CLEAR   Specific Gravity, Urine 1.028  1.005 - 1.030   pH 6.0  5.0 - 8.0   Glucose, UA NEGATIVE  NEGATIVE mg/dL   Hgb urine dipstick NEGATIVE  NEGATIVE   Bilirubin Urine NEGATIVE  NEGATIVE   Ketones, ur 15 (*) NEGATIVE mg/dL   Protein, ur NEGATIVE  NEGATIVE mg/dL   Urobilinogen, UA 1.0  0.0 - 1.0 mg/dL   Nitrite NEGATIVE  NEGATIVE   Leukocytes, UA NEGATIVE  NEGATIVE    Imaging Review No results found.   EKG Interpretation None      MDM   Final diagnoses:  Back pain, acute    Acute lumbar back pain without history of injury. No focal neuro deficits.  Will treat as a lumbar strain. Recommend bed rest for 2 days. Patient treated with Flexeril Naprosyn and hydrocodone. Patient was planning on leaving on a cruise in the morning recommend that that could be very uncomfortable the patient's decision. Patient has a primary care Dr. to followup with. No history of similar problems. Urinalysis not consistent with infection.    Shelda JakesScott W. Westlee Devita, MD 08/04/13 [phone]

## 2013-08-04 NOTE — Discharge Instructions (Signed)
Back Pain, Adult Low back pain is very common. About 1 in 5 people have back pain.The cause of low back pain is rarely dangerous. The pain often gets better over time.About half of people with a sudden onset of back pain feel better in just 2 weeks. About 8 in 10 people feel better by 6 weeks.  CAUSES Some common causes of back pain include:  Strain of the muscles or ligaments supporting the spine.  Wear and tear (degeneration) of the spinal discs.  Arthritis.  Direct injury to the back. DIAGNOSIS Most of the time, the direct cause of low back pain is not known.However, back pain can be treated effectively even when the exact cause of the pain is unknown.Answering your caregiver's questions about your overall health and symptoms is one of the most accurate ways to make sure the cause of your pain is not dangerous. If your caregiver needs more information, he or she may order lab work or imaging tests (X-rays or MRIs).However, even if imaging tests show changes in your back, this usually does not require surgery. HOME CARE INSTRUCTIONS For many people, back pain returns.Since low back pain is rarely dangerous, it is often a condition that people can learn to [facility] their own.   Remain active. It is stressful on the back to sit or stand in one place. Do not sit, drive, or stand in one place for more than 30 minutes at a time. Take short walks on level surfaces as soon as pain allows.Try to increase the length of time you walk each day.  Do not stay in bed.Resting more than 1 or 2 days can delay your recovery.  Do not avoid exercise or work.Your body is made to move.It is not dangerous to be active, even though your back may hurt.Your back will likely heal faster if you return to being active before your pain is gone.  Pay attention to your body when you bend and lift. Many people have less discomfortwhen lifting if they bend their knees, keep the load close to their bodies,and  avoid twisting. Often, the most comfortable positions are those that put less stress on your recovering back.  Find a comfortable position to sleep. Use a firm mattress and lie on your side with your knees slightly bent. If you lie on your back, put a pillow under your knees.  Only take over-the-counter or prescription medicines as directed by your caregiver. Over-the-counter medicines to reduce pain and inflammation are often the most helpful.Your caregiver may prescribe muscle relaxant drugs.These medicines help dull your pain so you can more quickly return to your normal activities and healthy exercise.  Put ice on the injured area.  Put ice in a plastic bag.  Place a towel between your skin and the bag.  Leave the ice on for 15-20 minutes, 03-04 times a day for the first 2 to 3 days. After that, ice and heat may be alternated to reduce pain and spasms.  Ask your caregiver about trying back exercises and gentle massage. This may be of some benefit.  Avoid feeling anxious or stressed.Stress increases muscle tension and can worsen back pain.It is important to recognize when you are anxious or stressed and learn ways to manage it.Exercise is a great option. SEEK MEDICAL CARE IF:  You have pain that is not relieved with rest or medicine.  You have pain that does not improve in 1 week.  You have new symptoms.  You are generally not feeling well. SEEK  IMMEDIATE MEDICAL CARE IF:   You have pain that radiates from your back into your legs.  You develop new bowel or bladder control problems.  You have unusual weakness or numbness in your arms or legs.  You develop nausea or vomiting.  You develop abdominal pain.  You feel faint. Document Released: 05/03/2005 Document Revised: 11/02/2011 Document Reviewed: 09/21/2010 [facility] Patient Information 2014 [employer], [place].  Recommend rest for the next 2 days. Off her feet as much as possible. Take medications as directed. Take the  Naprosyn on regular basis. The Flexeril will make you sleepy. But will help you rest. Take the hydrocodone as needed for additional pain. If not improved in one week it'll be time to have x-rays of your back.

## 2014-06-05 ENCOUNTER — Emergency Department (HOSPITAL_BASED_OUTPATIENT_CLINIC_OR_DEPARTMENT_OTHER): Payer: Managed Care, Other (non HMO)

## 2014-06-05 ENCOUNTER — Emergency Department (HOSPITAL_BASED_OUTPATIENT_CLINIC_OR_DEPARTMENT_OTHER)
Admission: EM | Admit: 2014-06-05 | Discharge: 2014-06-05 | Disposition: A | Discharge status: Home / Self Care | Payer: Managed Care, Other (non HMO) | Attending: Emergency Medicine | Admitting: Emergency Medicine

## 2014-06-05 ENCOUNTER — Encounter (HOSPITAL_BASED_OUTPATIENT_CLINIC_OR_DEPARTMENT_OTHER): Payer: Self-pay | Admitting: *Deleted

## 2014-06-05 DIAGNOSIS — Y9389 Activity, other specified: Secondary | ICD-10-CM | POA: Insufficient documentation

## 2014-06-05 DIAGNOSIS — S8001XA Contusion of right knee, initial encounter: Secondary | ICD-10-CM | POA: Diagnosis not present

## 2014-06-05 DIAGNOSIS — Z8709 Personal history of other diseases of the respiratory system: Secondary | ICD-10-CM | POA: Diagnosis not present

## 2014-06-05 DIAGNOSIS — Y92410 Unspecified street and highway as the place of occurrence of the external cause: Secondary | ICD-10-CM | POA: Diagnosis not present

## 2014-06-05 DIAGNOSIS — Y998 Other external cause status: Secondary | ICD-10-CM | POA: Diagnosis not present

## 2014-06-05 DIAGNOSIS — I10 Essential (primary) hypertension: Secondary | ICD-10-CM | POA: Diagnosis not present

## 2014-06-05 DIAGNOSIS — Z8739 Personal history of other diseases of the musculoskeletal system and connective tissue: Secondary | ICD-10-CM | POA: Diagnosis not present

## 2014-06-05 DIAGNOSIS — S90111A Contusion of right great toe without damage to nail, initial encounter: Secondary | ICD-10-CM | POA: Diagnosis not present

## 2014-06-05 DIAGNOSIS — S90121A Contusion of right lesser toe(s) without damage to nail, initial encounter: Secondary | ICD-10-CM

## 2014-06-05 DIAGNOSIS — T148XXA Other injury of unspecified body region, initial encounter: Secondary | ICD-10-CM

## 2014-06-05 DIAGNOSIS — S0081XA Abrasion of other part of head, initial encounter: Secondary | ICD-10-CM | POA: Insufficient documentation

## 2014-06-05 DIAGNOSIS — S8991XA Unspecified injury of right lower leg, initial encounter: Secondary | ICD-10-CM | POA: Diagnosis present

## 2014-06-05 DIAGNOSIS — Z79899 Other long term (current) drug therapy: Secondary | ICD-10-CM | POA: Diagnosis not present

## 2014-06-05 MED ORDER — HYDROCODONE-ACETAMINOPHEN 5-325 MG PO TABS: 1.0000 | ORAL_TABLET | ORAL | Status: DC | PRN

## 2014-06-05 MED ORDER — NAPROXEN 375 MG PO TABS: 375.0000 mg | ORAL_TABLET | Freq: Two times a day (BID) | ORAL | Status: DC

## 2014-06-05 NOTE — Discharge Instructions (Signed)

## 2014-06-05 NOTE — ED Notes (Addendum)
Dr Fredderick PhenixBelfi notified of pt's VS at discharge- pt directed follow up with pcp regarding bp- ice pack given for home use- d/c with ride- work note given
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
## 2015-07-09 ENCOUNTER — Emergency Department (HOSPITAL_BASED_OUTPATIENT_CLINIC_OR_DEPARTMENT_OTHER)
Admission: EM | Admit: 2015-07-09 | Discharge: 2015-07-09 | Disposition: A | Discharge status: Home / Self Care | Payer: Managed Care, Other (non HMO) | Attending: Emergency Medicine | Admitting: Emergency Medicine

## 2015-07-09 ENCOUNTER — Encounter (HOSPITAL_BASED_OUTPATIENT_CLINIC_OR_DEPARTMENT_OTHER): Payer: Self-pay | Admitting: Emergency Medicine

## 2015-07-09 DIAGNOSIS — J029 Acute pharyngitis, unspecified: Secondary | ICD-10-CM | POA: Insufficient documentation

## 2015-07-09 DIAGNOSIS — I10 Essential (primary) hypertension: Secondary | ICD-10-CM | POA: Diagnosis not present

## 2015-07-09 DIAGNOSIS — R509 Fever, unspecified: Secondary | ICD-10-CM | POA: Insufficient documentation

## 2015-07-09 DIAGNOSIS — J3489 Other specified disorders of nose and nasal sinuses: Secondary | ICD-10-CM | POA: Diagnosis not present

## 2015-07-09 DIAGNOSIS — R52 Pain, unspecified: Secondary | ICD-10-CM | POA: Diagnosis not present

## 2015-07-09 DIAGNOSIS — Z791 Long term (current) use of non-steroidal anti-inflammatories (NSAID): Secondary | ICD-10-CM | POA: Insufficient documentation

## 2015-07-09 DIAGNOSIS — Z8739 Personal history of other diseases of the musculoskeletal system and connective tissue: Secondary | ICD-10-CM | POA: Insufficient documentation

## 2015-07-09 DIAGNOSIS — Z79899 Other long term (current) drug therapy: Secondary | ICD-10-CM | POA: Insufficient documentation

## 2015-07-09 DIAGNOSIS — R6889 Other general symptoms and signs: Secondary | ICD-10-CM

## 2015-07-09 DIAGNOSIS — R05 Cough: Secondary | ICD-10-CM | POA: Insufficient documentation

## 2015-07-09 MED ORDER — OSELTAMIVIR PHOSPHATE 75 MG PO CAPS: 75.0000 mg | ORAL_CAPSULE | Freq: Two times a day (BID) | ORAL | Status: DC

## 2015-07-09 MED ORDER — ACETAMINOPHEN 325 MG PO TABS
650.0000 mg | ORAL_TABLET | Freq: Once | ORAL | Status: AC | PRN
  Administered 2015-07-09: 650 mg via ORAL
  Filled 2015-07-09: qty 2

## 2015-07-09 MED ORDER — GUAIFENESIN-CODEINE 100-10 MG/5ML PO SOLN: 5.0000 mL | Freq: Four times a day (QID) | ORAL | Status: DC | PRN

## 2015-07-09 NOTE — ED Notes (Signed)
Pt in c/o body aches, chills, fever, and cough onset yesterday.

## 2015-07-09 NOTE — ED Provider Notes (Signed)
CSN: [id number]     Arrival date & time 07/09/15  1922 History   First MD Initiated Contact with Patient 07/09/15 2016     Chief Complaint  Patient presents with  . Generalized Body Aches  . Cough     (Consider location/radiation/quality/duration/timing/severity/associated sxs/prior Treatment) HPI Comments: Patient presents to the emergency department with chief complaint of body aches. He reports associated fevers, chills, and dry productive cough. He states that the symptoms started suddenly yesterday. He reports sick contacts. He has not tried taking anything for her symptoms. There are no modifying factors.  The history is provided by the patient. No language interpreter was used.    Past Medical History  Diagnosis Date  . Seasonal allergies   . Hypertension     under control with med., has been on med. x 2 yr.  . 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 TFCC (triangular fibrocartilage complex) tear 09/2012    right   Past Surgical History  Procedure Laterality Date  . Knee arthroscopy Right   . Orchiopexy    . Cyst excision Right     wrist  . Wrist arthroscopy Right 10/11/2012    Procedure: ARTHROSCOPY RIGHT WRIST POSSIBLE SHRINKAGE DEBRIDEMENT FELDON DOME;  Surgeon: Nicki Reaper, MD;  Location: [facility];  Service: Orthopedics;  Laterality: Right;   History reviewed. No pertinent family history. Social History  Substance Use Topics  . Smoking status: Never Smoker   . Smokeless tobacco: Never Used  . Alcohol Use: No    Review of Systems  Constitutional: Positive for fever and chills.  HENT: Positive for postnasal drip, rhinorrhea, sinus pressure, sneezing and sore throat.   Respiratory: Positive for cough. Negative for shortness of breath.   Cardiovascular: Negative for chest pain.  Gastrointestinal: Negative for nausea, vomiting, abdominal pain, diarrhea and constipation.  Genitourinary: Negative for dysuria.  All other systems  reviewed and are negative.     Allergies  Review of patient's allergies indicates no known allergies.  Home Medications   Prior to Admission medications   Medication Sig Start Date End Date Taking? Authorizing Provider  albuterol (PROVENTIL HFA;VENTOLIN HFA) 108 (90 BASE) MCG/ACT inhaler Inhale 2 puffs into the lungs every 6 (six) hours as needed. For shortness of breath    Historical Provider, MD  benzonatate (TESSALON) 200 MG capsule Take 1 capsule (200 mg total) by mouth 3 (three) times daily as needed for cough. 06/22/13   Irish Elders, NP  cyclobenzaprine (FLEXERIL) 10 MG tablet Take 1 tablet (10 mg total) by mouth 2 (two) times daily as needed for muscle spasms. 08/04/13   Vanetta Mulders, MD  fexofenadine (ALLEGRA) 180 MG tablet Take 180 mg by mouth daily.    Historical Provider, MD  HYDROcodone-acetaminophen (NORCO/VICODIN) 5-325 MG per tablet Take 1-2 tablets by mouth every 4 (four) hours as needed. 06/05/14   Rolan Bucco, MD  lisinopril-hydrochlorothiazide (PRINZIDE,ZESTORETIC) 20-12.5 MG per tablet Take 1 tablet by mouth daily.    Historical Provider, MD  loratadine (CLARITIN) 10 MG tablet Take 1 tablet (10 mg total) by mouth daily. 09/28/11 06/05/14  Dayton Bailiff, MD  naproxen (NAPROSYN) 375 MG tablet Take 1 tablet (375 mg total) by mouth 2 (two) times daily. 06/05/14   Rolan Bucco, MD  oxyCODONE-acetaminophen (PERCOCET) 7.5-325 MG per tablet Take 1 tablet by mouth every 4 (four) hours as needed for pain. 10/11/12   Cindee Salt, MD  sildenafil (VIAGRA) 100 MG tablet Take 100  mg by mouth daily as needed for erectile dysfunction.    Historical Provider, MD  Tadalafil (CIALIS PO) Take by mouth daily.    Historical Provider, MD   BP 146/109 mmHg  Pulse 113  Temp(Src) 100.4 F (38 C) (Oral)  Resp 20  Ht  (1.854 m)  Wt 90.719 kg  BMI 26.39 kg/m2  SpO2 98% Physical Exam Physical Exam  Constitutional: Pt  is oriented to person, place, and time. Appears well-developed and  well-nourished. No distress.  HENT:  Head: Normocephalic and atraumatic.  Right Ear: Tympanic membrane, external ear and ear canal normal.  Left Ear: Tympanic membrane, external ear and ear canal normal.  Nose: Mucosal edema and mild rhinorrhea present. No epistaxis. Right sinus exhibits no maxillary sinus tenderness and no frontal sinus tenderness. Left sinus exhibits no maxillary sinus tenderness and no frontal sinus tenderness.  Mouth/Throat: Uvula is midline and mucous membranes are normal. Mucous membranes are not pale and not cyanotic. No oropharyngeal exudate, posterior oropharyngeal edema, posterior oropharyngeal erythema or tonsillar abscesses.  Eyes: Conjunctivae are normal. Pupils are equal, round, and reactive to light.  Neck: Normal range of motion and full passive range of motion without pain.  Cardiovascular: Normal rate and intact distal pulses.   Pulmonary/Chest: Effort normal and breath sounds normal. No stridor.  Clear and equal breath sounds without focal wheezes, rhonchi, rales  Abdominal: Soft. Bowel sounds are normal. There is no tenderness.  Musculoskeletal: Normal range of motion.  Lymphadenopathy:    Pthas no cervical adenopathy.  Neurological: Pt is alert and oriented to person, place, and time.  Skin: Skin is warm and dry. No rash noted. Pt is not diaphoretic.  Psychiatric: Normal mood and affect.  Nursing note and vitals reviewed.   ED Course  Procedures (including critical care time)   MDM   Final diagnoses:  Flu-like symptoms    Patient with symptoms consistent with influenza.  Vitals are stable, low-grade fever.  No signs of dehydration, tolerating PO's.  Lungs are clear. Due to patient's presentation and physical exam a chest x-ray was not ordered bc likely diagnosis of flu.  Patient will be discharged with instructions to orally hydrate, rest, and use over-the-counter medications such as anti-inflammatories ibuprofen and Aleve for muscle aches and  Tylenol for fever.  Patient will also be given a cough suppressant.      Roxy Horseman, PA-C 07/09/15 2033  Vanetta Mulders, MD 07/10/15 [phone]

## 2015-07-09 NOTE — Discharge Instructions (Signed)

## 2015-08-03 ENCOUNTER — Emergency Department (HOSPITAL_BASED_OUTPATIENT_CLINIC_OR_DEPARTMENT_OTHER)
Admission: EM | Admit: 2015-08-03 | Discharge: 2015-08-03 | Disposition: A | Discharge status: Home / Self Care | Payer: Managed Care, Other (non HMO) | Attending: Emergency Medicine | Admitting: Emergency Medicine

## 2015-08-03 ENCOUNTER — Encounter (HOSPITAL_BASED_OUTPATIENT_CLINIC_OR_DEPARTMENT_OTHER): Payer: Self-pay | Admitting: *Deleted

## 2015-08-03 DIAGNOSIS — I10 Essential (primary) hypertension: Secondary | ICD-10-CM | POA: Diagnosis not present

## 2015-08-03 DIAGNOSIS — S3992XA Unspecified injury of lower back, initial encounter: Secondary | ICD-10-CM | POA: Diagnosis not present

## 2015-08-03 DIAGNOSIS — Y9289 Other specified places as the place of occurrence of the external cause: Secondary | ICD-10-CM | POA: Diagnosis not present

## 2015-08-03 DIAGNOSIS — Y9389 Activity, other specified: Secondary | ICD-10-CM | POA: Diagnosis not present

## 2015-08-03 DIAGNOSIS — Y998 Other external cause status: Secondary | ICD-10-CM | POA: Insufficient documentation

## 2015-08-03 DIAGNOSIS — X58XXXA Exposure to other specified factors, initial encounter: Secondary | ICD-10-CM | POA: Diagnosis not present

## 2015-08-03 NOTE — ED Notes (Signed)
After triage pt states he does not wish wait. Went to speak to his wife (who also was checking in as a patient) to see if they are going to stay. Advised they can return at any time

## 2015-08-03 NOTE — ED Notes (Signed)
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

## 2015-08-03 NOTE — ED Notes (Signed)
Pt expresses unhappiness with the approximate wait times -- reports he plans to seek treatment at a different time. Ambulating well; NAD noted at this time.

## 2015-08-04 ENCOUNTER — Emergency Department (HOSPITAL_BASED_OUTPATIENT_CLINIC_OR_DEPARTMENT_OTHER)
Admission: EM | Admit: 2015-08-04 | Discharge: 2015-08-05 | Disposition: A | Discharge status: Home / Self Care | Payer: Managed Care, Other (non HMO) | Attending: Emergency Medicine | Admitting: Emergency Medicine

## 2015-08-04 ENCOUNTER — Encounter (HOSPITAL_BASED_OUTPATIENT_CLINIC_OR_DEPARTMENT_OTHER): Payer: Self-pay | Admitting: Emergency Medicine

## 2015-08-04 DIAGNOSIS — I10 Essential (primary) hypertension: Secondary | ICD-10-CM | POA: Diagnosis not present

## 2015-08-04 DIAGNOSIS — Z79899 Other long term (current) drug therapy: Secondary | ICD-10-CM | POA: Insufficient documentation

## 2015-08-04 DIAGNOSIS — Z87828 Personal history of other (healed) physical injury and trauma: Secondary | ICD-10-CM | POA: Diagnosis not present

## 2015-08-04 DIAGNOSIS — M545 Low back pain, unspecified: Secondary | ICD-10-CM

## 2015-08-04 NOTE — ED Notes (Signed)
Patient states that he feels like he has a pulled muscle to his lower back. Reports that this occurred last week on tues or wed

## 2015-08-05 MED ORDER — NAPROXEN 500 MG PO TABS: 500.0000 mg | ORAL_TABLET | Freq: Two times a day (BID) | ORAL | Status: DC

## 2015-08-05 MED ORDER — METHOCARBAMOL 500 MG PO TABS
500.0000 mg | ORAL_TABLET | Freq: Once | ORAL | Status: AC
  Administered 2015-08-05: 500 mg via ORAL
  Filled 2015-08-05: qty 1

## 2015-08-05 MED ORDER — HYDROCODONE-ACETAMINOPHEN 5-325 MG PO TABS
1.0000 | ORAL_TABLET | Freq: Once | ORAL | Status: AC
  Administered 2015-08-05: 1 via ORAL
  Filled 2015-08-05: qty 1

## 2015-08-05 MED ORDER — METHOCARBAMOL 500 MG PO TABS: 500.0000 mg | ORAL_TABLET | Freq: Two times a day (BID) | ORAL | Status: DC

## 2015-08-05 MED ORDER — TRAMADOL HCL 50 MG PO TABS: 50.0000 mg | ORAL_TABLET | Freq: Four times a day (QID) | ORAL | Status: DC | PRN

## 2015-08-05 NOTE — ED Provider Notes (Signed)
CSN: [id number]     Arrival date & time 08/04/15  2330 History   First MD Initiated Contact with Patient 08/05/15 0032     Chief Complaint  Patient presents with  . Back Pain     (Consider location/radiation/quality/duration/timing/severity/associated sxs/prior Treatment) The history is provided by the patient and medical records. No language interpreter was used.   Gregory Harvey is a 50 y.o. male  with a PMH of HTN who presents to the Emergency Department complaining of persistent, constant right lower back pain 1 week. Patient states he felt a sudden onset of sharp pain when he twisted strangely last week.  ibuprofen taken with mild relief. Symptoms aggravated with movement, relieved with rest. Patient denies fever, saddle anesthesia, bowel/bladder incontinence, recent steroid use, IV drug use.  Past Medical History  Diagnosis Date  . Seasonal allergies   . Hypertension     under control with med., has been on med. x 2 yr.  . 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 TFCC (triangular fibrocartilage complex) tear 09/2012    right   Past Surgical History  Procedure Laterality Date  . Knee arthroscopy Right   . Orchiopexy    . Cyst excision Right     wrist  . Wrist arthroscopy Right 10/11/2012    Procedure: ARTHROSCOPY RIGHT WRIST POSSIBLE SHRINKAGE DEBRIDEMENT FELDON DOME;  Surgeon: Nicki Reaper, MD;  [facility];  Service: Orthopedics;  Laterality: Right;   History reviewed. No pertinent family history. Social History  Substance Use Topics  . Smoking status: Never Smoker   . Smokeless tobacco: Never Used  . Alcohol Use: No    Review of Systems  Constitutional: Negative for fever.  HENT: Negative for congestion.   Eyes: Negative for visual disturbance.  Respiratory: Negative for shortness of breath.   Cardiovascular: Negative for chest pain.  Gastrointestinal: Negative for abdominal pain.  Genitourinary: Negative for  difficulty urinating.  Musculoskeletal: Positive for back pain.  Skin: Negative for color change.  Neurological: Negative for numbness.      Allergies  Review of patient's allergies indicates no known allergies.  Home Medications   Prior to Admission medications   Medication Sig Start Date End Date Taking? Authorizing Provider  albuterol (PROVENTIL HFA;VENTOLIN HFA) 108 (90 BASE) MCG/ACT inhaler Inhale 2 puffs into the lungs every 6 (six) hours as needed. For shortness of breath    Historical Provider, MD  benzonatate (TESSALON) 200 MG capsule Take 1 capsule (200 mg total) by mouth 3 (three) times daily as needed for cough. 06/22/13   Irish Elders, NP  cyclobenzaprine (FLEXERIL) 10 MG tablet Take 1 tablet (10 mg total) by mouth 2 (two) times daily as needed for muscle spasms. 08/04/13   Vanetta Mulders, MD  fexofenadine (ALLEGRA) 180 MG tablet Take 180 mg by mouth daily.    Historical Provider, MD  guaiFENesin-codeine 100-10 MG/5ML syrup Take 5 mLs by mouth every 6 (six) hours as needed for cough. 07/09/15   Roxy Horseman, PA-C  HYDROcodone-acetaminophen (NORCO/VICODIN) 5-325 MG per tablet Take 1-2 tablets by mouth every 4 (four) hours as needed. 06/05/14   Rolan Bucco, MD  lisinopril-hydrochlorothiazide (PRINZIDE,ZESTORETIC) 20-12.5 MG per tablet Take 1 tablet by mouth daily.    Historical Provider, MD  loratadine (CLARITIN) 10 MG tablet Take 1 tablet (10 mg total) by mouth daily. 09/28/11 06/05/14  Dayton Bailiff, MD  methocarbamol (ROBAXIN) 500 MG tablet Take 1 tablet (500 mg total) by mouth  2 (two) times daily. 08/05/15   Chase Picket Ward, PA-C  naproxen (NAPROSYN) 500 MG tablet Take 1 tablet (500 mg total) by mouth 2 (two) times daily. 08/05/15   Chase Picket Ward, PA-C  oseltamivir (TAMIFLU) 75 MG capsule Take 1 capsule (75 mg total) by mouth every 12 (twelve) hours. 07/09/15   Roxy Horseman, PA-C  oxyCODONE-acetaminophen (PERCOCET) 7.5-325 MG per tablet Take 1 tablet by mouth every 4  (four) hours as needed for pain. 10/11/12   Cindee Salt, MD  sildenafil (VIAGRA) 100 MG tablet Take 100 mg by mouth daily as needed for erectile dysfunction.    Historical Provider, MD  Tadalafil (CIALIS PO) Take by mouth daily.    Historical Provider, MD  traMADol (ULTRAM) 50 MG tablet Take 1 tablet (50 mg total) by mouth every 6 (six) hours as needed. 08/05/15   Jaime Pilcher Ward, PA-C   BP 129/88 mmHg  Pulse 90  Temp(Src) 97.9 F (36.6 C) (Oral)  Resp 18  Ht  (1.854 m)  Wt 90.719 kg  BMI 26.39 kg/m2  SpO2 98% Physical Exam  Constitutional: He is oriented to person, place, and time. He appears well-developed and well-nourished.  NAD  Neck:   Full ROM without pain  No midline tenderness  No tenderness of paraspinal musculature  Cardiovascular: Normal rate, regular rhythm, normal heart sounds and intact distal pulses.  Exam reveals no gallop and no friction rub.   No murmur heard. Pulmonary/Chest: Effort normal and breath sounds normal. No respiratory distress. He has no wheezes. He has no rales.  Abdominal: Soft. Bowel sounds are normal. He exhibits no distension. There is no tenderness.  Musculoskeletal:  Patient is able to ambulate without difficulty. No noted deformities or signs of inflammation. No overlying skin changes. Curvature of cervical, thoracic, and lumbar spine within normal limits. No midline tenderness; tenderness to palpation of right lumbar paraspinal musculature. Straight leg raises are  negative bilaterally for radicular symptoms. 5/5 muscle strength of bilateral LE's   Neurological: He is alert and oriented to person, place, and time. He has normal reflexes.   Bilateral lower extremities neurovascularly intact.  Skin: Skin is warm and dry. No rash noted. No erythema.  Nursing note and vitals reviewed.   ED Course  Procedures (including critical care time) Labs Review Labs Reviewed - No data to display  Imaging Review No results found. I have personally  reviewed and evaluated these images and lab results as part of my medical decision-making.   EKG Interpretation None      MDM   Final diagnoses:  Right-sided low back pain without sciatica   Gregory Harvey presents with back pain likely of musculoskeletal etiology. No red flag sxs of low back pain. Normal neuro exam. No loss of bowel or bladder control. No concern for cauda equina. No fever, night sweats, weight loss, h/o cancer, IVDU. Lower extremities are neurovascularly intact and patient is ambulating in ED. Patient was counseled on back pain precautions and told to do activity as tolerated but do not lift, push, or pull heavy objects more than 10 pounds for the next week. Patient counseled to use ice or heat on back for no longer than 15 minutes every hour.   Patient prescribed muscle relaxer and narcotic pain medicine and counseled on proper use of these medications. Urged patient not to drink alcohol, drive, or perform any other activities that requires focus while taking either of these medications.   Patient urged to follow-up with PCP if  pain does not improve with treatment and rest or if pain becomes recurrent. Urged to return with worsening severe pain, loss of bowel or bladder control, trouble walking.   The patient verbalizes understanding and agrees with the plan.  [facility]oscopy Center LLCJaime Pilcher Ward, PA-C 08/05/15 0114  Paula LibraJohn Molpus, MD 08/05/15 [phone]

## 2015-08-05 NOTE — Discharge Instructions (Signed)
Back Pain:  Your back pain should be treated with medicines such as ibuprofen or aleve and this back pain should get better over the next 2 weeks.  However if you develop severe or worsening pain, low back pain with fever, numbness, weakness or inability to walk or urinate, you should return to the ER immediately.  Please follow up with your doctor this week for a recheck if still having symptoms.  Low back pain is discomfort in the lower back that may be due to injuries to muscles and ligaments around the spine. Occasionally, it may be caused by a a problem to a part of the spine called a disc. The pain may last several days or a week;  However, most patients get completely well in 4 weeks.  COLD THERAPY DIRECTIONS:  Ice or gel packs can be used to reduce both pain and swelling. Ice is the most helpful within the first 24 to 48 hours after an injury or flareup from overusing a muscle or joint.  Ice is effective, has very few side effects, and is safe for most people to use.   If you expose your skin to cold temperatures for too long or without the proper protection, you can damage your skin or nerves. Watch for signs of skin damage due to cold.   HOME CARE INSTRUCTIONS  Follow these tips to use ice and cold packs safely.  Place a dry or damp towel between the ice and skin. A damp towel will cool the skin more quickly, so you may need to shorten the time that the ice is used.  For a more rapid response, add gentle compression to the ice.  Ice for no more than 10 to 20 minutes at a time. The bonier the area you are icing, the less time it will take to get the benefits of ice.  Check your skin after 5 minutes to make sure there are no signs of a poor response to cold or skin damage.  Rest 20 minutes or more in between uses.  Once your skin is numb, you can end your treatment. You can test numbness by very lightly touching your skin. The touch should be so light that you do not see the skin dimple  from the pressure of your fingertip. When using ice, most people will feel these normal sensations in this order: cold, burning, aching, and numbness.   Non steroidal anti inflammatory medications including Ibuprofen and naproxen;  These medications help both pain and swelling and are very useful in treating back pain.  They should be taken with food, as they can cause stomach upset, and more seriously, stomach bleeding.    Muscle relaxants:  These medications can help with muscle tightness that is a cause of lower back pain.  Most of these medications can cause drowsiness, and it is not safe to drive or use dangerous machinery while taking them.  You will need to follow up with  your primary healthcare provider in 1-2 weeks for reassessment.  Be aware that if you develop new symptoms, such as a fever, leg weakness, difficulty with or loss of control of your urine or bowels, abdominal pain, or more severe pain, you will need to seek medical attention and  / or return to the Emergency department.
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
## 2016-03-25 ENCOUNTER — Emergency Department (HOSPITAL_BASED_OUTPATIENT_CLINIC_OR_DEPARTMENT_OTHER): Payer: Managed Care, Other (non HMO)

## 2016-03-25 ENCOUNTER — Emergency Department (HOSPITAL_BASED_OUTPATIENT_CLINIC_OR_DEPARTMENT_OTHER)
Admission: EM | Admit: 2016-03-25 | Discharge: 2016-03-25 | Disposition: A | Discharge status: Home / Self Care | Payer: Managed Care, Other (non HMO) | Attending: Emergency Medicine | Admitting: Emergency Medicine

## 2016-03-25 ENCOUNTER — Encounter (HOSPITAL_BASED_OUTPATIENT_CLINIC_OR_DEPARTMENT_OTHER): Payer: Self-pay

## 2016-03-25 DIAGNOSIS — Z791 Long term (current) use of non-steroidal anti-inflammatories (NSAID): Secondary | ICD-10-CM | POA: Diagnosis not present

## 2016-03-25 DIAGNOSIS — I10 Essential (primary) hypertension: Secondary | ICD-10-CM | POA: Insufficient documentation

## 2016-03-25 DIAGNOSIS — Z79899 Other long term (current) drug therapy: Secondary | ICD-10-CM | POA: Diagnosis not present

## 2016-03-25 DIAGNOSIS — J069 Acute upper respiratory infection, unspecified: Secondary | ICD-10-CM | POA: Diagnosis not present

## 2016-03-25 DIAGNOSIS — R05 Cough: Secondary | ICD-10-CM | POA: Diagnosis present

## 2016-03-25 DIAGNOSIS — B9789 Other viral agents as the cause of diseases classified elsewhere: Secondary | ICD-10-CM

## 2016-03-25 LAB — RAPID STREP SCREEN (MED CTR MEBANE ONLY): Streptococcus, Group A Screen (Direct): NEGATIVE

## 2016-03-25 MED ORDER — HYDROCOD POLST-CPM POLST ER 10-8 MG/5ML PO SUER: 5.0000 mL | Freq: Two times a day (BID) | ORAL | 0 refills | Status: DC | PRN

## 2016-03-25 NOTE — ED Triage Notes (Signed)
Pt c/o cough, nasal drainage and sore throat for a week with upper body aches

## 2016-03-25 NOTE — ED Provider Notes (Signed)
[facility] Provider Note   CSN: [id number] Arrival date & time: 03/25/16  2107  By signing my name below, I, Nelwyn SalisburyJoshua Fowler, attest that this documentation has been prepared under the direction and in the presence of non-physician practitioner, Felicie Mornavid Daemon Dowty, NP. Electronically Signed: Nelwyn SalisburyJoshua Fowler, Scribe. 03/25/2016. 9:23 PM.  History   Chief Complaint Chief Complaint  Patient presents with  . Cough   The history is provided by the patient. No language interpreter was used.  Cough  This is a new problem. The current episode started more than 2 days ago. The problem occurs constantly. The problem has not changed since onset.The cough is non-productive. The maximum temperature recorded prior to his arrival was 100 to 100.9 F. The fever has been present for 3 to 4 days. Associated symptoms include rhinorrhea, sore throat and myalgias (Chest Wall). Pertinent negatives include no chills and no ear pain.    HPI Comments:  Gregory Harvey is a 50 y.o. male with pmhx of HTN who presents to the Emergency Department complaining of sudden-onset constant non-productive cough beginning 6 days ago. No modifying factors indicated. Pt reports associated chest wall/back pain (secondary to cough), fever, sore throat and rhinorrhea. He denies any chills or ear ache. Pt states that his mother was seen recently for similar symptoms and was told she did not have strep throat.  Past Medical History:  Diagnosis Date  . Ganglion cyst of wrist 09/2012   recurrent - right wrist  . Hypertension    under control with med., has been on med. x 2 yr.  . Seasonal allergies   . TFCC (triangular fibrocartilage complex) tear 09/2012   right    There are no active problems to display for this patient.   Past Surgical History:  Procedure Laterality Date  . CYST EXCISION Right    wrist  . KNEE ARTHROSCOPY Right   . ORCHIOPEXY    . WRIST ARTHROSCOPY Right 10/11/2012   Procedure: ARTHROSCOPY RIGHT  WRIST POSSIBLE SHRINKAGE DEBRIDEMENT FELDON DOME;  Surgeon: Nicki ReaperGary R Kuzma, MD;  Location: [facility];  Service: Orthopedics;  Laterality: Right;     Home Medications    Prior to Admission medications   Medication Sig Start Date End Date Taking? Authorizing Provider  albuterol (PROVENTIL HFA;VENTOLIN HFA) 108 (90 BASE) MCG/ACT inhaler Inhale 2 puffs into the lungs every 6 (six) hours as needed. For shortness of breath    Historical Provider, MD  benzonatate (TESSALON) 200 MG capsule Take 1 capsule (200 mg total) by mouth 3 (three) times daily as needed for cough. 06/22/13   Irish EldersKelly Walker, FNP  cyclobenzaprine (FLEXERIL) 10 MG tablet Take 1 tablet (10 mg total) by mouth 2 (two) times daily as needed for muscle spasms. 08/04/13   Vanetta MuldersScott Zackowski, MD  fexofenadine (ALLEGRA) 180 MG tablet Take 180 mg by mouth daily.    Historical Provider, MD  guaiFENesin-codeine 100-10 MG/5ML syrup Take 5 mLs by mouth every 6 (six) hours as needed for cough. 07/09/15   Roxy Horsemanobert Browning, PA-C  HYDROcodone-acetaminophen (NORCO/VICODIN) 5-325 MG per tablet Take 1-2 tablets by mouth every 4 (four) hours as needed. 06/05/14   Rolan BuccoMelanie Belfi, MD  lisinopril-hydrochlorothiazide (PRINZIDE,ZESTORETIC) 20-12.5 MG per tablet Take 1 tablet by mouth daily.    Historical Provider, MD  loratadine (CLARITIN) 10 MG tablet Take 1 tablet (10 mg total) by mouth daily. 09/28/11 06/05/14  Dayton BailiffAndrew King, MD  methocarbamol (ROBAXIN) 500 MG tablet Take 1 tablet (500 mg total) by mouth 2 (two)  times daily. 08/05/15   Chase Picket Ward, PA-C  naproxen (NAPROSYN) 500 MG tablet Take 1 tablet (500 mg total) by mouth 2 (two) times daily. 08/05/15   Chase Picket Ward, PA-C  oseltamivir (TAMIFLU) 75 MG capsule Take 1 capsule (75 mg total) by mouth every 12 (twelve) hours. 07/09/15   Roxy Horseman, PA-C  oxyCODONE-acetaminophen (PERCOCET) 7.5-325 MG per tablet Take 1 tablet by mouth every 4 (four) hours as needed for pain. 10/11/12   Cindee Salt, MD    sildenafil (VIAGRA) 100 MG tablet Take 100 mg by mouth daily as needed for erectile dysfunction.    Historical Provider, MD  Tadalafil (CIALIS PO) Take by mouth daily.    Historical Provider, MD  traMADol (ULTRAM) 50 MG tablet Take 1 tablet (50 mg total) by mouth every 6 (six) hours as needed. 08/05/15   Chase Picket Ward, PA-C    Family History No family history on file.  Social History Social History  Substance Use Topics  . Smoking status: Never Smoker  . Smokeless tobacco: Never Used  . Alcohol use No     Allergies   Patient has no known allergies.   Review of Systems Review of Systems  Constitutional: Positive for fever. Negative for chills.  HENT: Positive for rhinorrhea and sore throat. Negative for ear pain.   Respiratory: Positive for cough.   Musculoskeletal: Positive for back pain and myalgias (Chest Wall).  All other systems reviewed and are negative.    Physical Exam Updated Vital Signs BP (!) 156/110 (BP Location: Right Arm)   Pulse 94   Temp 98.1 F (36.7 C) (Oral)   Resp 18   Ht 6\' 1"  (1.854 m)   Wt 200 lb (90.7 kg)   SpO2 98%   BMI 26.39 kg/m   Physical Exam  Constitutional: He is oriented to person, place, and time. He appears well-developed and well-nourished. No distress.  HENT:  Head: Normocephalic and atraumatic.  Posterior orpharyngeal ertyhema.   Eyes: Conjunctivae are normal.  Cardiovascular: Normal rate.   Pulmonary/Chest: Effort normal and breath sounds normal. No respiratory distress. He has no wheezes. He has no rales.  Abdominal: He exhibits no distension.  Neurological: He is alert and oriented to person, place, and time.  Skin: Skin is warm and dry.  Psychiatric: He has a normal mood and affect.  Nursing note and vitals reviewed.    ED Treatments / Results  DIAGNOSTIC STUDIES:  Oxygen Saturation is 98% on RA, normal by my interpretation.    COORDINATION OF CARE:  9:31 PM Discussed treatment plan with pt at bedside  which included strep swab and pt agreed to plan.  Labs (all labs ordered are listed, but only abnormal results are displayed) Labs Reviewed  RAPID STREP SCREEN (NOT AT [facility])  CULTURE, GROUP A STREP [facility])    EKG  EKG Interpretation None       Radiology Dg Chest 2 View  Result Date: 03/25/2016 CLINICAL DATA:  Cough with nasal drainage EXAM: CHEST  2 VIEW COMPARISON:  08/01/2012 FINDINGS: The heart size and mediastinal contours are within normal limits. Both lungs are clear. The visualized skeletal structures are unremarkable. IMPRESSION: No active cardiopulmonary disease. Electronically Signed   By: Jasmine Pang M.D.   On: 03/25/2016 22:08    Procedures Procedures (including critical care time)  Medications Ordered in ED Medications - No data to display   Initial Impression / Assessment and Plan / ED Course  I have reviewed the triage vital signs and  the nursing notes.  Pertinent labs & imaging results that were available during my care of the patient were reviewed by me and considered in my medical decision making (see chart for details).  Clinical Course   Pt symptoms consistent with URI. CXR negative for acute infiltrate. Pt will be discharged with symptomatic treatment.  Discussed return precautions.  Pt is hemodynamically stable & in NAD prior to discharge.    Final Clinical Impressions(s) / ED Diagnoses   Final diagnoses:  Viral URI with cough    New Prescriptions New Prescriptions   CHLORPHENIRAMINE-HYDROCODONE (TUSSIONEX PENNKINETIC ER) 10-8 MG/5ML SUER    Take 5 mLs by mouth every 12 (twelve) hours as needed for cough.   I personally performed the services described in this documentation, which was scribed in my presence. The recorded information has been reviewed and is accurate.     Felicie Mornavid Arelyn Gauer, NP 03/25/16 [phone]    Geoffery Lyonsouglas Delo, MD 03/25/16 2233

## 2016-03-25 NOTE — ED Notes (Signed)
ED Provider at bedside discussing test results and dispo plan of care. 

## 2016-03-28 LAB — CULTURE, GROUP A STREP (THRC)

## 2016-12-24 ENCOUNTER — Emergency Department (HOSPITAL_BASED_OUTPATIENT_CLINIC_OR_DEPARTMENT_OTHER)
Admission: EM | Admit: 2016-12-24 | Discharge: 2016-12-24 | Disposition: A | Discharge status: Home / Self Care | Payer: Medicaid Other | Attending: Emergency Medicine | Admitting: Emergency Medicine

## 2016-12-24 ENCOUNTER — Encounter (HOSPITAL_BASED_OUTPATIENT_CLINIC_OR_DEPARTMENT_OTHER): Payer: Self-pay | Admitting: *Deleted

## 2016-12-24 DIAGNOSIS — M7918 Myalgia, other site: Secondary | ICD-10-CM

## 2016-12-24 DIAGNOSIS — M545 Low back pain: Secondary | ICD-10-CM | POA: Diagnosis present

## 2016-12-24 DIAGNOSIS — I10 Essential (primary) hypertension: Secondary | ICD-10-CM | POA: Diagnosis not present

## 2016-12-24 DIAGNOSIS — Z79899 Other long term (current) drug therapy: Secondary | ICD-10-CM | POA: Diagnosis not present

## 2016-12-24 DIAGNOSIS — M542 Cervicalgia: Secondary | ICD-10-CM | POA: Insufficient documentation

## 2016-12-24 MED ORDER — METHOCARBAMOL 500 MG PO TABS: 500.0000 mg | ORAL_TABLET | Freq: Three times a day (TID) | ORAL | 0 refills | Status: DC | PRN

## 2016-12-24 MED ORDER — IBUPROFEN 600 MG PO TABS: 600.0000 mg | ORAL_TABLET | Freq: Three times a day (TID) | ORAL | 0 refills | Status: DC | PRN

## 2016-12-24 MED ORDER — IBUPROFEN 400 MG PO TABS
600.0000 mg | ORAL_TABLET | Freq: Once | ORAL | Status: AC
  Administered 2016-12-24: 600 mg via ORAL
  Filled 2016-12-24: qty 1

## 2016-12-24 NOTE — ED Triage Notes (Signed)
Lower bilateral back pain x 3 days. Denies known injury. Worse pain with movement.

## 2016-12-25 NOTE — ED Provider Notes (Signed)
[facility]   CSN: [id number] Arrival date & time: 12/24/16  2243     History   Chief Complaint Chief Complaint  Patient presents with  . Back Pain    HPI Gregory Harvey is a 51 y.o. male.  HPI Patient reports mild neck and low back stiffness over the past 3 days without known injury.  Denies fevers and chills.  No weakness of his arms or legs.  No paresthesias experienced.  Denies recent injury or trauma.  Denies heavy lifting.  States his discomfort and pain is worse with movement.  He does sometimes sleep in the couch does not remember sleeping wrong.  Denies chest pain.  No shortness of breath.  Denies cough.  Denies abdominal pain.  Patient has not tried any medications prior to arrival.   Past Medical History:  Diagnosis Date  . Ganglion cyst of wrist 09/2012   recurrent - right wrist  . Hypertension    under control with med., has been on med. x 2 yr.  . Seasonal allergies   . TFCC (triangular fibrocartilage complex) tear 09/2012   right    There are no active problems to display for this patient.   Past Surgical History:  Procedure Laterality Date  . CYST EXCISION Right    wrist  . KNEE ARTHROSCOPY Right   . ORCHIOPEXY    . WRIST ARTHROSCOPY Right 10/11/2012   Procedure: ARTHROSCOPY RIGHT WRIST POSSIBLE SHRINKAGE DEBRIDEMENT FELDON DOME;  Surgeon: Nicki ReaperGary R Kuzma, MD;  Location: [facility];  Service: Orthopedics;  Laterality: Right;       Home Medications    Prior to Admission medications   Medication Sig Start Date End Date Taking? Authorizing Provider  albuterol (PROVENTIL HFA;VENTOLIN HFA) 108 (90 BASE) MCG/ACT inhaler Inhale 2 puffs into the lungs every 6 (six) hours as needed. For shortness of breath    [provider]  fexofenadine (ALLEGRA) 180 MG tablet Take 180 mg by mouth daily.    [provider]       Azalia Bilisampos, Kellyn Mccary, MD  lisinopril-hydrochlorothiazide (PRINZIDE,ZESTORETIC) 20-12.5 MG  per tablet Take 1 tablet by mouth daily.    [provider]  loratadine (CLARITIN) 10 MG tablet Take 1 tablet (10 mg total) by mouth daily. 09/28/11 06/05/14  Dayton BailiffKing, Andrew, MD       Azalia Bilisampos, Carmelia Tiner, MD  sildenafil (VIAGRA) 100 MG tablet Take 100 mg by mouth daily as needed for erectile dysfunction.    [provider]  Tadalafil (CIALIS PO) Take by mouth daily.    [provider]    Family History History reviewed. No pertinent family history.  Social History Social History  Substance Use Topics  . Smoking status: Never Smoker  . Smokeless tobacco: Never Used  . Alcohol use No     Allergies   Patient has no known allergies.   Review of Systems Review of Systems  All other systems reviewed and are negative.    Physical Exam Updated Vital Signs BP (!) 139/112 (BP Location: Left Arm)   Pulse (!) 118   Temp 98.1 F (36.7 C) (Oral)   Resp 16   SpO2 98%   Physical Exam  Constitutional: He is oriented to person, place, and time. He appears well-developed and well-nourished.  HENT:  Head: Normocephalic and atraumatic.  Eyes: EOM are normal.  Neck: Normal range of motion.  Mild paracervical tenderness without cervical point tenderness.  Cardiovascular: Normal rate, regular rhythm, normal heart sounds and  intact distal pulses.   Pulmonary/Chest: Effort normal and breath sounds normal. No respiratory distress.  Abdominal: Soft. He exhibits no distension. There is no tenderness.  Musculoskeletal: Normal range of motion.  Mild paralumbar tenderness without lumbar point tenderness.  No thoracic tenderness  Neurological: He is alert and oriented to person, place, and time.  Skin: Skin is warm and dry.  Psychiatric: He has a normal mood and affect. Judgment normal.  Nursing note and vitals reviewed.    ED Treatments / Results  Labs (all labs ordered are listed, but only abnormal results are displayed) Labs Reviewed - No data to display  EKG  EKG  Interpretation None       Radiology No results found.    Procedures Procedures (including critical care time)  Medications Ordered in ED Medications  ibuprofen (ADVIL,MOTRIN) tablet 600 mg (600 mg Oral Given 12/24/16 2335)     Initial Impression / Assessment and Plan / ED Course  I have reviewed the triage vital signs and the nursing notes.  Pertinent labs & imaging results that were available during my care of the patient were reviewed by me and considered in my medical decision making (see chart for details).    Normal lower extremity neurologic exam. No bowel or bladder complaints. No back pain red flags. Likely musculoskeletal back pain. Doubt spinal epidural abscess. Doubt cauda equina. Doubt abdominal aortic aneurysm   Final Clinical Impressions(s) / ED Diagnoses   Final diagnoses:  Cervical muscle pain  Lumbar muscle pain    New Prescriptions Discharge Medication List as of 12/24/2016 11:31 PM    START taking these medications   Details  ibuprofen (ADVIL,MOTRIN) 600 MG tablet Take 1 tablet (600 mg total) by mouth every 8 (eight) hours as needed., Starting Fri 12/24/2016, Print    methocarbamol (ROBAXIN) 500 MG tablet Take 1 tablet (500 mg total) by mouth every 8 (eight) hours as needed for muscle spasms., Starting Fri 12/24/2016, Print         Azalia Bilis, MD 12/25/16 [phone]

## 2017-01-04 ENCOUNTER — Emergency Department (HOSPITAL_BASED_OUTPATIENT_CLINIC_OR_DEPARTMENT_OTHER): Payer: Medicaid Other

## 2017-01-04 ENCOUNTER — Encounter (HOSPITAL_BASED_OUTPATIENT_CLINIC_OR_DEPARTMENT_OTHER): Payer: Self-pay | Admitting: Emergency Medicine

## 2017-01-04 ENCOUNTER — Emergency Department (HOSPITAL_BASED_OUTPATIENT_CLINIC_OR_DEPARTMENT_OTHER)
Admission: EM | Admit: 2017-01-04 | Discharge: 2017-01-04 | Disposition: A | Discharge status: Home / Self Care | Payer: Medicaid Other | Attending: Emergency Medicine | Admitting: Emergency Medicine

## 2017-01-04 DIAGNOSIS — Z79899 Other long term (current) drug therapy: Secondary | ICD-10-CM | POA: Diagnosis not present

## 2017-01-04 DIAGNOSIS — J069 Acute upper respiratory infection, unspecified: Secondary | ICD-10-CM | POA: Insufficient documentation

## 2017-01-04 DIAGNOSIS — R05 Cough: Secondary | ICD-10-CM | POA: Diagnosis present

## 2017-01-04 DIAGNOSIS — I10 Essential (primary) hypertension: Secondary | ICD-10-CM | POA: Diagnosis not present

## 2017-01-04 DIAGNOSIS — B9789 Other viral agents as the cause of diseases classified elsewhere: Secondary | ICD-10-CM

## 2017-01-04 MED ORDER — GUAIFENESIN 200 MG PO TABS: 200.0000 mg | ORAL_TABLET | ORAL | 0 refills | Status: DC | PRN

## 2017-01-04 MED ORDER — FLUTICASONE PROPIONATE 50 MCG/ACT NA SUSP: 2.0000 | Freq: Every day | NASAL | 0 refills | Status: DC

## 2017-01-04 MED ORDER — BENZONATATE 100 MG PO CAPS: 100.0000 mg | ORAL_CAPSULE | Freq: Three times a day (TID) | ORAL | 0 refills | Status: DC

## 2017-01-04 MED ORDER — IBUPROFEN 800 MG PO TABS
800.0000 mg | ORAL_TABLET | Freq: Once | ORAL | Status: AC
  Administered 2017-01-04: 800 mg via ORAL
  Filled 2017-01-04: qty 1

## 2017-01-04 MED ORDER — BENZONATATE 100 MG PO CAPS
200.0000 mg | ORAL_CAPSULE | Freq: Once | ORAL | Status: AC
  Administered 2017-01-04: 200 mg via ORAL
  Filled 2017-01-04: qty 2

## 2017-01-04 MED ORDER — DEXAMETHASONE SODIUM PHOSPHATE 10 MG/ML IJ SOLN: 10.0000 mg | Freq: Once | INTRAMUSCULAR | Status: DC

## 2017-01-04 NOTE — ED Provider Notes (Signed)
[facility]   CSN: [id number] Arrival date & time: 01/04/17  0252     History   Chief Complaint Chief Complaint  Patient presents with  . Cough    HPI Gregory Harvey is a 51 y.o. male.  The history is provided by the patient.  Cough  This is a new problem. The current episode started more than 2 days ago. The problem occurs constantly. The problem has not changed since onset.There has been no fever. Pertinent negatives include no chest pain, no chills, no sweats, no weight loss, no ear congestion, no ear pain, no rhinorrhea, no sore throat, no myalgias, no shortness of breath, no wheezing and no eye redness. He has tried nothing for the symptoms. The treatment provided no relief. He is not a smoker. His past medical history does not include bronchitis, pneumonia, bronchiectasis, COPD or emphysema.    Past Medical History:  Diagnosis Date  . Ganglion cyst of wrist 09/2012   recurrent - right wrist  . Hypertension    under control with med., has been on med. x 2 yr.  . Seasonal allergies   . TFCC (triangular fibrocartilage complex) tear 09/2012   right    There are no active problems to display for this patient.   Past Surgical History:  Procedure Laterality Date  . CYST EXCISION Right    wrist  . KNEE ARTHROSCOPY Right   . ORCHIOPEXY    . WRIST ARTHROSCOPY Right 10/11/2012   Procedure: ARTHROSCOPY RIGHT WRIST POSSIBLE SHRINKAGE DEBRIDEMENT FELDON DOME;  Surgeon: Nicki Reaper, MD;  Location: [facility];  Service: Orthopedics;  Laterality: Right;       Home Medications    Prior to Admission medications   Medication Sig Start Date End Date Taking? Authorizing Provider  atorvastatin (LIPITOR) 10 MG tablet Take 10 mg by mouth daily.   Yes [provider]  albuterol (PROVENTIL HFA;VENTOLIN HFA) 108 (90 BASE) MCG/ACT inhaler Inhale 2 puffs into the lungs every 6 (six) hours as needed. For shortness of breath     [provider]  fexofenadine (ALLEGRA) 180 MG tablet Take 180 mg by mouth daily.    [provider]  ibuprofen (ADVIL,MOTRIN) 600 MG tablet Take 1 tablet (600 mg total) by mouth every 8 (eight) hours as needed. 12/24/16   Azalia Bilis, MD  lisinopril-hydrochlorothiazide (PRINZIDE,ZESTORETIC) 20-12.5 MG per tablet Take 1 tablet by mouth daily.    [provider]  loratadine (CLARITIN) 10 MG tablet Take 1 tablet (10 mg total) by mouth daily. 09/28/11 06/05/14  Dayton Bailiff, MD  methocarbamol (ROBAXIN) 500 MG tablet Take 1 tablet (500 mg total) by mouth every 8 (eight) hours as needed for muscle spasms. 12/24/16   Azalia Bilis, MD  sildenafil (VIAGRA) 100 MG tablet Take 100 mg by mouth daily as needed for erectile dysfunction.    [provider]  Tadalafil (CIALIS PO) Take by mouth daily.    [provider]    Family History No family history on file.  Social History Social History  Substance Use Topics  . Smoking status: Never Smoker  . Smokeless tobacco: Never Used  . Alcohol use No     Allergies   Patient has no known allergies.   Review of Systems Review of Systems  Constitutional: Negative for chills, fever and weight loss.  HENT: Negative for ear pain, rhinorrhea and sore throat.   Eyes: Negative for redness.  Respiratory: Positive for cough. Negative for shortness  of breath, wheezing and stridor.   Cardiovascular: Negative for chest pain.  Musculoskeletal: Negative for myalgias.  All other systems reviewed and are negative.    Physical Exam Updated Vital Signs BP (!) 153/105 (BP Location: Right Arm)   Pulse 83   Temp 98.7 F (37.1 C) (Oral)   Resp 19   Ht 6\' 1"  (1.854 m)   Wt 95.3 kg (210 lb)   SpO2 97%   BMI 27.71 kg/m   Physical Exam  Constitutional: He is oriented to person, place, and time. He appears well-developed and well-nourished.  HENT:  Head: Normocephalic and atraumatic.  Mouth/Throat: No oropharyngeal  exudate.  Eyes: Pupils are equal, round, and reactive to light. Conjunctivae are normal.  Neck: Normal range of motion. Neck supple.  Cardiovascular: Normal rate, regular rhythm, normal heart sounds and intact distal pulses.   Pulmonary/Chest: Effort normal and breath sounds normal. No respiratory distress. He has no wheezes. He has no rales.  Abdominal: Soft. Bowel sounds are normal. He exhibits no mass. There is no tenderness. There is no rebound and no guarding.  Musculoskeletal: Normal range of motion.  Neurological: He is alert and oriented to person, place, and time. He displays normal reflexes.  Skin: Skin is warm and dry. Capillary refill takes less than 2 seconds.  Psychiatric: He has a normal mood and affect.     ED Treatments / Results  Labs (all labs ordered are listed, but only abnormal results are displayed) Labs Reviewed - No data to display  EKG  EKG Interpretation None       Radiology Dg Chest 2 View  Result Date: 01/04/2017 CLINICAL DATA:  Productive cough for 1 week. Chest pain with inspiration EXAM: CHEST  2 VIEW COMPARISON:  Radiographs 03/25/2016 FINDINGS: The cardiomediastinal contours are normal. The lungs are clear. Pulmonary vasculature is normal. No consolidation, pleural effusion, or pneumothorax. No acute osseous abnormalities are seen. IMPRESSION: No active cardiopulmonary disease. Electronically Signed   By: Rubye Oaks M.D.   On: 01/04/2017 03:38    Procedures Procedures (including critical care time)  Medications Ordered in ED Medications  benzonatate (TESSALON) capsule 200 mg (not administered)  ibuprofen (ADVIL,MOTRIN) tablet 800 mg (not administered)       Final Clinical Impressions(s) / ED Diagnoses    The patient is very well appearing and has been observed in the ED.  Strict return precautions given for  intractable rash, swelling or the lips tongue or floor of the mouth, chest pain, dyspnea on exertion, wheezing, weakness,  vomiting, diarrhea,  Inability to tolerate liquids or food, fevers > 101, rashes on the skin, altered mental status or any concerns. No signs of systemic illness or infection. The patient is nontoxic-appearing on exam and vital signs are within normal limits.    I have reviewed the triage vital signs and the nursing notes. Pertinent labs &imaging results that were available during my care of the patient were reviewed by me and considered in my medical decision making (see chart for details).  After history, exam, and medical workup I feel the patient has been appropriately medically screened and is safe for discharge home. Pertinent diagnoses were discussed with the patient. Patient was given return precautions.    Merle Whitehorn, MD 01/04/17 [phone]

## 2017-01-04 NOTE — ED Triage Notes (Signed)
Reports productive cough x 1 week. Denies fever, n/v/d, SOB. Reports L sided CP with inspiration and lower back pain. Also reports blood tinged sputum.

## 2017-03-18 ENCOUNTER — Emergency Department (HOSPITAL_BASED_OUTPATIENT_CLINIC_OR_DEPARTMENT_OTHER)
Admission: EM | Admit: 2017-03-18 | Discharge: 2017-03-18 | Disposition: A | Discharge status: Home / Self Care | Payer: Medicaid Other | Attending: Emergency Medicine | Admitting: Emergency Medicine

## 2017-03-18 ENCOUNTER — Encounter (HOSPITAL_BASED_OUTPATIENT_CLINIC_OR_DEPARTMENT_OTHER): Payer: Self-pay | Admitting: *Deleted

## 2017-03-18 DIAGNOSIS — Y9389 Activity, other specified: Secondary | ICD-10-CM | POA: Diagnosis not present

## 2017-03-18 DIAGNOSIS — Z79899 Other long term (current) drug therapy: Secondary | ICD-10-CM | POA: Insufficient documentation

## 2017-03-18 DIAGNOSIS — I10 Essential (primary) hypertension: Secondary | ICD-10-CM | POA: Insufficient documentation

## 2017-03-18 DIAGNOSIS — S3992XA Unspecified injury of lower back, initial encounter: Secondary | ICD-10-CM | POA: Diagnosis present

## 2017-03-18 DIAGNOSIS — Y929 Unspecified place or not applicable: Secondary | ICD-10-CM | POA: Diagnosis not present

## 2017-03-18 DIAGNOSIS — M545 Low back pain, unspecified: Secondary | ICD-10-CM

## 2017-03-18 DIAGNOSIS — Y998 Other external cause status: Secondary | ICD-10-CM | POA: Insufficient documentation

## 2017-03-18 DIAGNOSIS — X509XXA Other and unspecified overexertion or strenuous movements or postures, initial encounter: Secondary | ICD-10-CM | POA: Insufficient documentation

## 2017-03-18 DIAGNOSIS — S39012A Strain of muscle, fascia and tendon of lower back, initial encounter: Secondary | ICD-10-CM | POA: Diagnosis not present

## 2017-03-18 MED ORDER — METHOCARBAMOL 500 MG PO TABS: 500.0000 mg | ORAL_TABLET | Freq: Two times a day (BID) | ORAL | 0 refills | Status: DC

## 2017-03-18 MED ORDER — IBUPROFEN 600 MG PO TABS: 600.0000 mg | ORAL_TABLET | Freq: Four times a day (QID) | ORAL | 0 refills | Status: DC | PRN

## 2017-03-18 NOTE — Discharge Instructions (Signed)
Please read and follow all provided instructions.  Your diagnoses today include:  1. Acute right-sided low back pain without sciatica   2. Strain of lumbar region, initial encounter     Tests performed today include: Vital signs - see below for your results today  Medications prescribed:   Take any prescribed medications only as directed.  Home care instructions:  Follow any educational materials contained in this packet Please rest, use ice or heat on your back for the next several days Do not lift, push, pull anything more than 10 pounds for the next week  Follow-up instructions: Please follow-up with your primary care provider in the next 1 week for further evaluation of your symptoms.   Return instructions:  SEEK IMMEDIATE MEDICAL ATTENTION IF YOU HAVE: New numbness, tingling, weakness, or problem with the use of your arms or legs Severe back pain not relieved with medications Loss control of your bowels or bladder Increasing pain in any areas of the body (such as chest or abdominal pain) Shortness of breath, dizziness, or fainting.  Worsening nausea (feeling sick to your stomach), vomiting, fever, or sweats Any other emergent concerns regarding your health   Additional Information:  Your vital signs today were: BP (!) 166/110 (BP Location: Right Arm) Comment: Has not taken BP meds this AM   Pulse 88    Temp 99.2 F (37.3 C) (Oral)    Resp 20    Ht 6\' 1"  (1.854 m)    Wt 95.3 kg (210 lb)    SpO2 98%    BMI 27.71 kg/m  If your blood pressure (BP) was elevated above 135/85 this visit, please have this repeated by your doctor within one month. --------------

## 2017-03-18 NOTE — ED Provider Notes (Signed)
[facility] EMERGENCY DEPARTMENT Provider Note   CSN: [id number] Arrival date & time: 03/18/17  1429     History   Chief Complaint Chief Complaint  Patient presents with  . Back Pain    HPI Gregory Harvey is a 51 y.o. male.  HPI  51 y.o. male hx HTN, presents to the Emergency Department today due to back pain. States that this is mid back. Notes turning the wrong way this morning and feeling pain since then. Rates pain 8/10. Describes as aching sensation like a pulled muscle. No meds PTA. No loss of bowel or bladder function. No saddle anesthesia. No numbness/tingling. No other symptoms noted   Past Medical History:  Diagnosis Date  . Ganglion cyst of wrist 09/2012   recurrent - right wrist  . Hypertension    under control with med., has been on med. x 2 yr.  . Seasonal allergies   . TFCC (triangular fibrocartilage complex) tear 09/2012   right    There are no active problems to display for this patient.   Past Surgical History:  Procedure Laterality Date  . CYST EXCISION Right    wrist  . KNEE ARTHROSCOPY Right   . ORCHIOPEXY    . WRIST ARTHROSCOPY Right 10/11/2012   Procedure: ARTHROSCOPY RIGHT WRIST POSSIBLE SHRINKAGE DEBRIDEMENT FELDON DOME;  Surgeon: Nicki ReaperGary R Kuzma, MD;  Location: [facility];  Service: Orthopedics;  Laterality: Right;       Home Medications    Prior to Admission medications   Medication Sig Start Date End Date Taking? Authorizing Provider  albuterol (PROVENTIL HFA;VENTOLIN HFA) 108 (90 BASE) MCG/ACT inhaler Inhale 2 puffs into the lungs every 6 (six) hours as needed. For shortness of breath    [provider]  atorvastatin (LIPITOR) 10 MG tablet Take 10 mg by mouth daily.    [provider]  benzonatate (TESSALON) 100 MG capsule Take 1 capsule (100 mg total) by mouth every 8 (eight) hours. 01/04/17   Palumbo, April, MD  fexofenadine (ALLEGRA) 180 MG tablet Take 180 mg by mouth daily.    [provider]  fluticasone (FLONASE) 50 MCG/ACT nasal spray Place 2 sprays into both nostrils daily. 01/04/17   Palumbo, April, MD  guaiFENesin 200 MG tablet Take 1 tablet (200 mg total) by mouth every 4 (four) hours as needed for cough or to loosen phlegm. 01/04/17   Palumbo, April, MD  ibuprofen (ADVIL,MOTRIN) 600 MG tablet Take 1 tablet (600 mg total) by mouth every 8 (eight) hours as needed. 12/24/16   Azalia Bilisampos, Kevin, MD  lisinopril-hydrochlorothiazide (PRINZIDE,ZESTORETIC) 20-12.5 MG per tablet Take 1 tablet by mouth daily.    [provider]  loratadine (CLARITIN) 10 MG tablet Take 1 tablet (10 mg total) by mouth daily. 09/28/11 06/05/14  Dayton BailiffKing, Andrew, MD  methocarbamol (ROBAXIN) 500 MG tablet Take 1 tablet (500 mg total) by mouth every 8 (eight) hours as needed for muscle spasms. 12/24/16   Azalia Bilisampos, Kevin, MD  sildenafil (VIAGRA) 100 MG tablet Take 100 mg by mouth daily as needed for erectile dysfunction.    [provider]  Tadalafil (CIALIS PO) Take by mouth daily.    [provider]    Family History No family history on file.  Social History Social History  Substance Use Topics  . Smoking status: Never Smoker  . Smokeless tobacco: Never Used  . Alcohol use No     Allergies   Patient has no known allergies.   Review of  Systems Review of Systems ROS reviewed and all are negative for acute change except as noted in the HPI.  Physical Exam Updated Vital Signs BP (!) 166/110 (BP Location: Right Arm) Comment: Has not taken BP meds this AM  Pulse 88   Temp 99.2 F (37.3 C) (Oral)   Resp 20   Ht 6\' 1"  (1.854 m)   Wt 95.3 kg (210 lb)   SpO2 98%   BMI 27.71 kg/m   Physical Exam  Constitutional: He is oriented to person, place, and time. Vital signs are normal. He appears well-developed and well-nourished.  HENT:  Head: Normocephalic and atraumatic.  Right Ear: Hearing normal.  Left Ear: Hearing normal.  Eyes: Pupils are equal, round, and reactive  to light. Conjunctivae and EOM are normal.  Neck: Normal range of motion. Neck supple.  Cardiovascular: Normal rate, regular rhythm, normal heart sounds and intact distal pulses.   Pulmonary/Chest: Effort normal and breath sounds normal.  Musculoskeletal: Normal range of motion.  TTP right lower lumbar musculature. No midline C/T/L spinous process tenderness   Neurological: He is alert and oriented to person, place, and time.  Skin: Skin is warm and dry.  Psychiatric: He has a normal mood and affect. His speech is normal and behavior is normal. Thought content normal.  Nursing note and vitals reviewed.    ED Treatments / Results  Labs (all labs ordered are listed, but only abnormal results are displayed) Labs Reviewed - No data to display  EKG  EKG Interpretation None       Radiology No results found.  Procedures Procedures (including critical care time)  Medications Ordered in ED Medications - No data to display   Initial Impression / Assessment and Plan / ED Course  I have reviewed the triage vital signs and the nursing notes.  Pertinent labs & imaging results that were available during my care of the patient were reviewed by me and considered in my medical decision making (see chart for details).  Final Clinical Impressions(s) / ED Diagnoses     {I have reviewed the relevant previous healthcare records.  {I obtained HPI from historian.   ED Course:  Assessment: Patient is a 51 y.o. male States he reached the wrong way this am and felt a sharp pain in his mid back.. No neurological deficits appreciated. Patient is ambulatory. No warning symptoms of back pain including: fecal incontinence, urinary retention or overflow incontinence, night sweats, waking from sleep with back pain, unexplained fevers or weight loss, h/o cancer, IVDU, recent trauma. No concern for cauda equina, epidural abscess, or other serious cause of back pain. Conservative measures such as rest,  ice/heat and pain medicine indicated with PCP follow-up if no improvement with conservative management.   Disposition/Plan:  DC Home Additional Verbal discharge instructions given and discussed with patient.  Pt Instructed to f/u with PCP in the next week for evaluation and treatment of symptoms. Return precautions given Pt acknowledges and agrees with plan  Supervising Physician Cardama, Amadeo Garnet, *  Final diagnoses:  Acute right-sided low back pain without sciatica  Strain of lumbar region, initial encounter    New Prescriptions New Prescriptions   No medications on file     Audry Pili, PA-C 03/18/17 1556    Cardama, Amadeo Garnet, MD 03/20/17 [phone]

## 2017-03-18 NOTE — ED Triage Notes (Signed)
States he reached the wrong way this am and felt a sharp pain in his mid back.

## 2017-11-28 IMAGING — DX DG CHEST 2V
2 series · 2 of 2 positions shown · non-contrast
Comparison: 08/01/2012

CLINICAL DATA: Cough with nasal drainage

EXAM:
CHEST  2 VIEW

[chest pa]
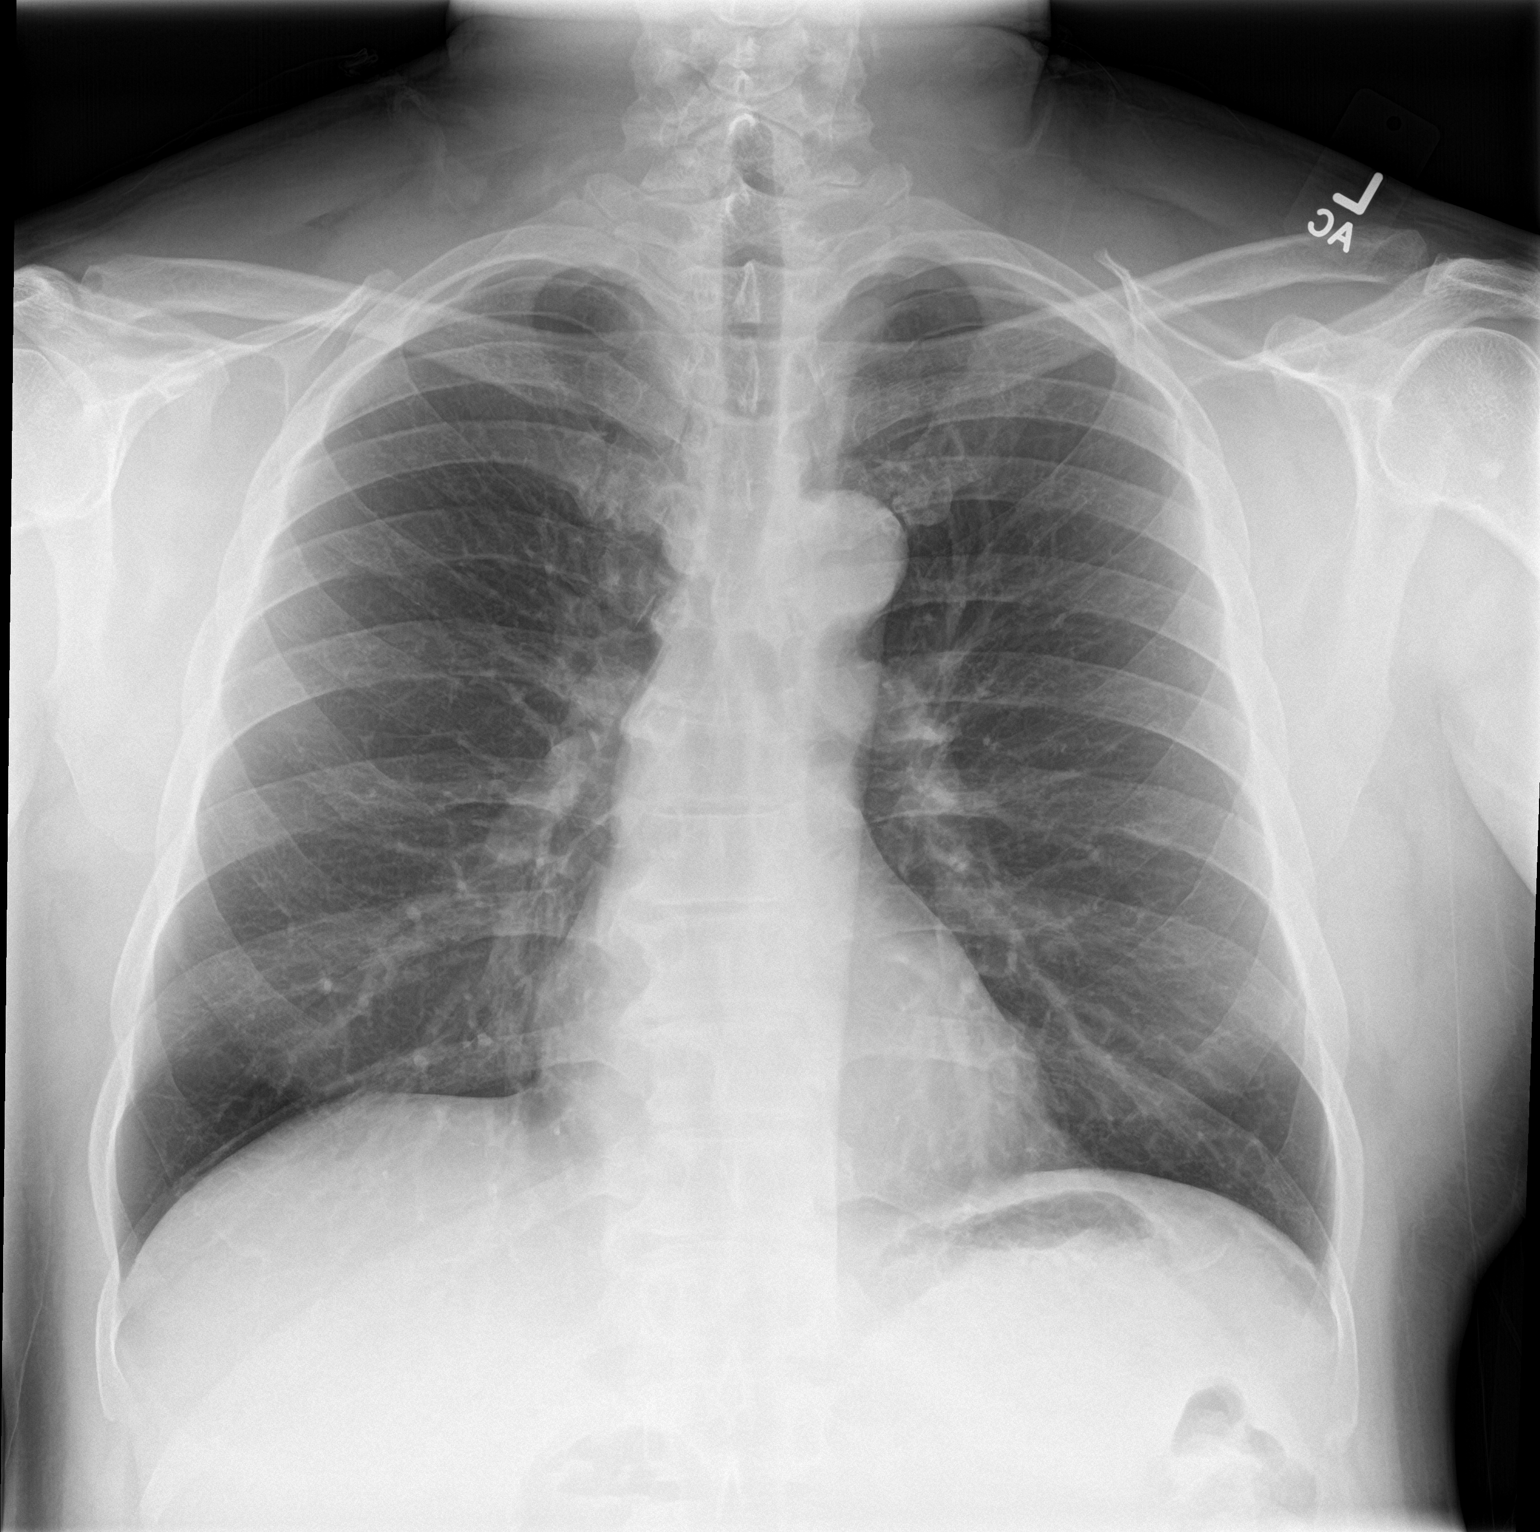

[chest lat]
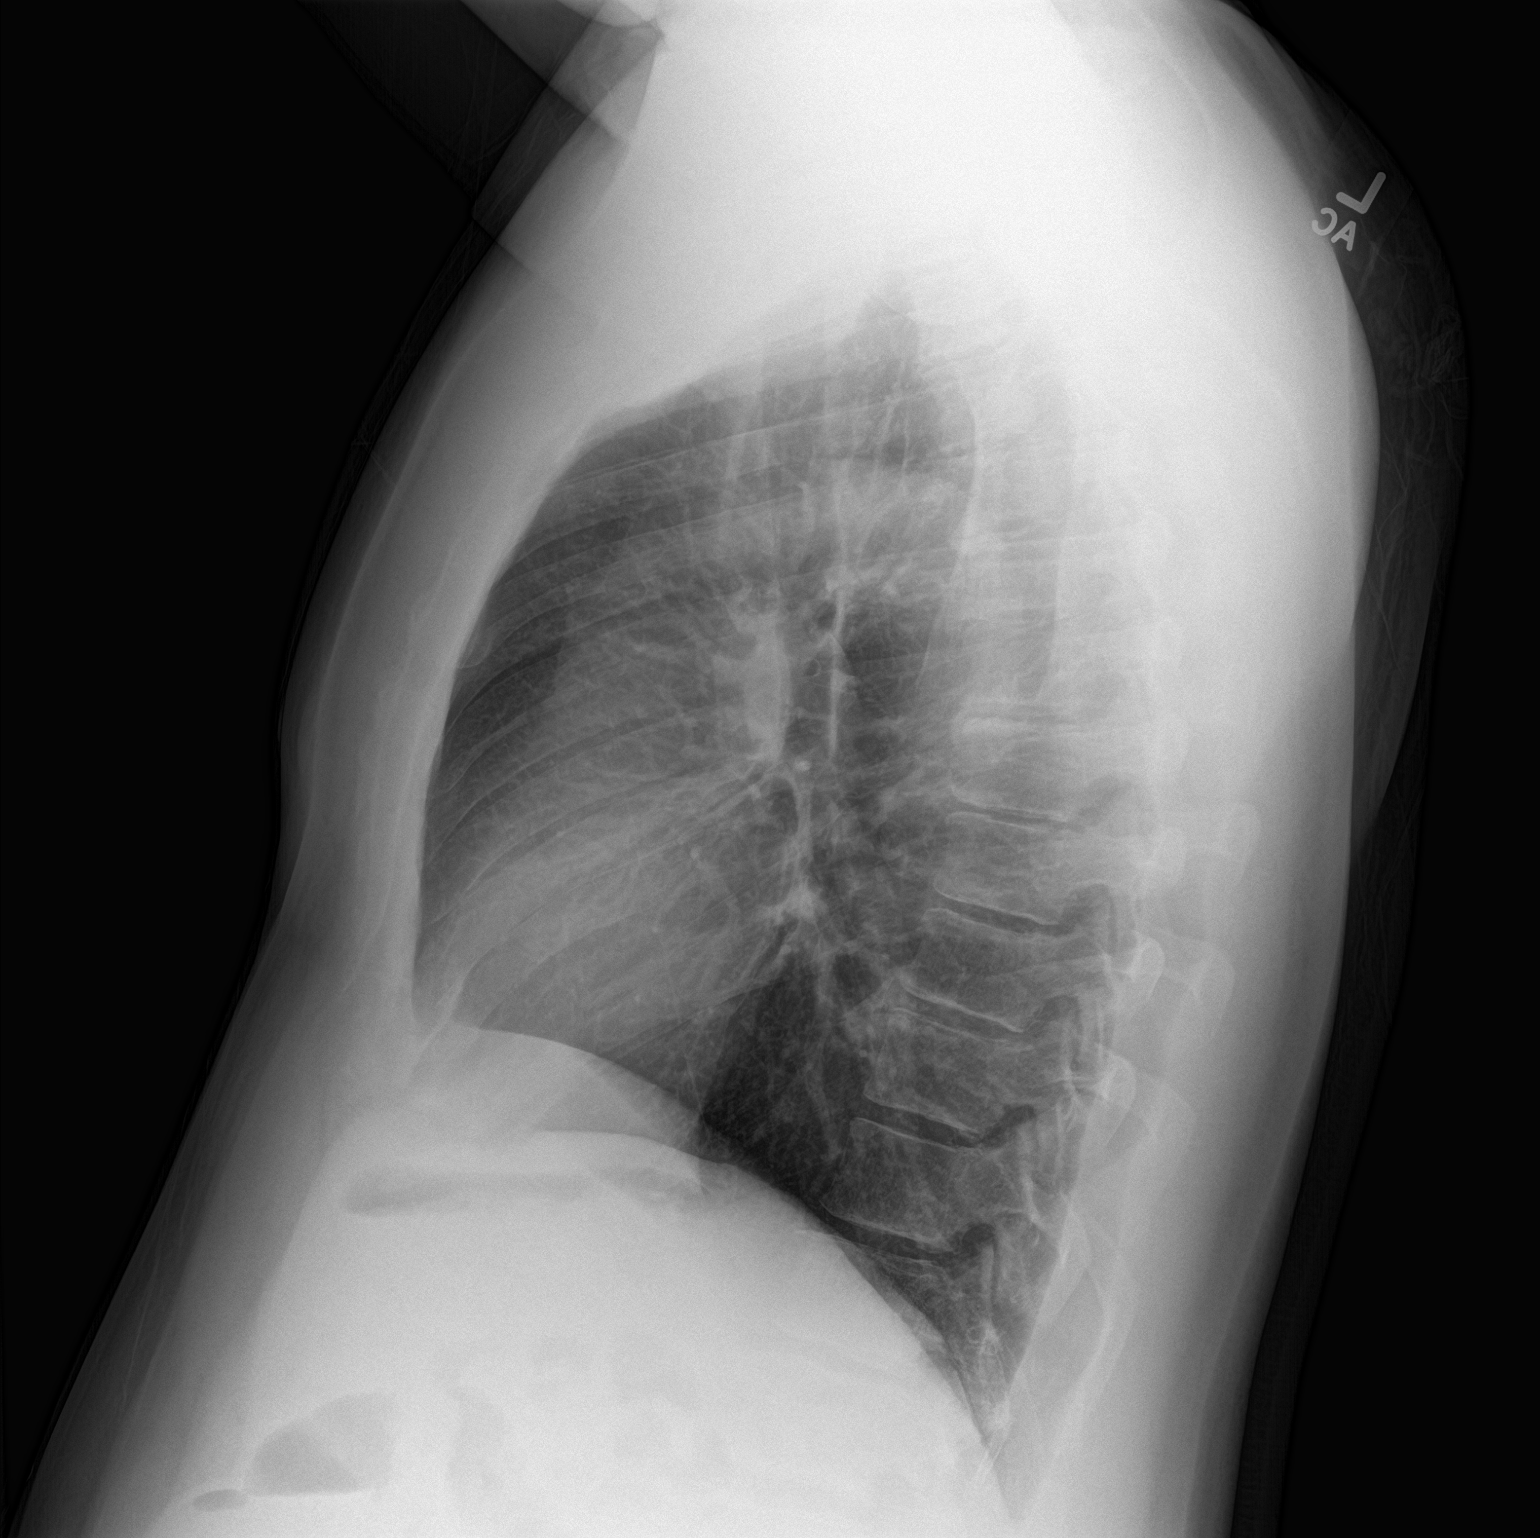

[2 of 2 positions shown; findings below may reference images not displayed]

FINDINGS: The heart size and mediastinal contours are within normal limits.
Both lungs are clear. The visualized skeletal structures are
unremarkable.
IMPRESSION: No active cardiopulmonary disease.

## 2020-02-13 ENCOUNTER — Emergency Department (HOSPITAL_BASED_OUTPATIENT_CLINIC_OR_DEPARTMENT_OTHER)
Admission: EM | Admit: 2020-02-13 | Discharge: 2020-02-13 | Disposition: A | Discharge status: Home / Self Care | Payer: 59 | Attending: Emergency Medicine | Admitting: Emergency Medicine

## 2020-02-13 ENCOUNTER — Encounter (HOSPITAL_BASED_OUTPATIENT_CLINIC_OR_DEPARTMENT_OTHER): Payer: Self-pay | Admitting: Emergency Medicine

## 2020-02-13 ENCOUNTER — Other Ambulatory Visit: Payer: Self-pay

## 2020-02-13 DIAGNOSIS — T1592XA Foreign body on external eye, part unspecified, left eye, initial encounter: Secondary | ICD-10-CM | POA: Insufficient documentation

## 2020-02-13 DIAGNOSIS — H579 Unspecified disorder of eye and adnexa: Secondary | ICD-10-CM

## 2020-02-13 DIAGNOSIS — I10 Essential (primary) hypertension: Secondary | ICD-10-CM | POA: Diagnosis not present

## 2020-02-13 DIAGNOSIS — X58XXXA Exposure to other specified factors, initial encounter: Secondary | ICD-10-CM | POA: Insufficient documentation

## 2020-02-13 DIAGNOSIS — Z79899 Other long term (current) drug therapy: Secondary | ICD-10-CM | POA: Diagnosis not present

## 2020-02-13 MED ORDER — TETRACAINE HCL 0.5 % OP SOLN
2.0000 [drp] | Freq: Once | OPHTHALMIC | Status: AC
  Administered 2020-02-13: 2 [drp] via OPHTHALMIC
  Filled 2020-02-13: qty 4

## 2020-02-13 MED ORDER — FLUORESCEIN SODIUM 1 MG OP STRP
1.0000 | ORAL_STRIP | Freq: Once | OPHTHALMIC | Status: AC
  Administered 2020-02-13: 1 via OPHTHALMIC
  Filled 2020-02-13: qty 1

## 2020-02-13 NOTE — ED Triage Notes (Signed)
Pt woke up with left eye pain watering. Pt reports light sensitivity.

## 2020-02-13 NOTE — Discharge Instructions (Signed)
Please call your primary ophthalmologist office today to get a close follow-up appointment, ideally to be seen today.  If they are unable to see you today, would recommend calling Dr. Vonna Kotyk' office and requesting an appointment today. If you develop numbness, weakness, vision changes, blurry vision, or other new concerning symptom, return to ER for reassessment.

## 2020-02-13 NOTE — ED Provider Notes (Addendum)
[facility] EMERGENCY DEPARTMENT Provider Note   CSN: [id number] Arrival date & time: 02/13/20  0448     History Chief Complaint  Patient presents with  . Eye Pain    Gregory Harvey is a 54 y.o. male.  Presents to ER with concern for foreign body sensation in left eye.  Reports he went to bed last night without any symptoms.  When he woke up he noted some discomfort in his eye, sensation of foreign body, noted his left eye was watering more than normal.  Pain is mild to moderate, no alleviating or aggravating factors.  Has not tried anything yet.  Has prescription glasses but does not use contacts.  Denies associated blurry vision.  No numbness or weakness or tingling of the face.  No known trauma to face,.  HPI     Past Medical History:  Diagnosis Date  . Ganglion cyst of wrist 09/2012   recurrent - right wrist  . Hypertension    under control with med., has been on med. x 2 yr.  . Seasonal allergies   . TFCC (triangular fibrocartilage complex) tear 09/2012   right    There are no problems to display for this patient.   Past Surgical History:  Procedure Laterality Date  . CYST EXCISION Right    wrist  . KNEE ARTHROSCOPY Right   . ORCHIOPEXY    . WRIST ARTHROSCOPY Right 10/11/2012   Procedure: ARTHROSCOPY RIGHT WRIST POSSIBLE SHRINKAGE DEBRIDEMENT FELDON DOME;  Surgeon: Nicki Reaper, MD;  Location: [facility];  Service: Orthopedics;  Laterality: Right;       No family history on file.  Social History   Tobacco Use  . Smoking status: Never Smoker  . Smokeless tobacco: Never Used  Vaping Use  . Vaping Use: Never used  Substance Use Topics  . Alcohol use: No  . Drug use: No    Home Medications Prior to Admission medications   Medication Sig Start Date End Date Taking? Authorizing Provider  albuterol (PROVENTIL HFA;VENTOLIN HFA) 108 (90 BASE) MCG/ACT inhaler Inhale 2 puffs into the lungs every 6 (six) hours as needed. For shortness  of breath    [provider]  atorvastatin (LIPITOR) 10 MG tablet Take 10 mg by mouth daily.    [provider]  benzonatate (TESSALON) 100 MG capsule Take 1 capsule (100 mg total) by mouth every 8 (eight) hours. 01/04/17   Palumbo, April, MD  fexofenadine (ALLEGRA) 180 MG tablet Take 180 mg by mouth daily.    [provider]  fluticasone (FLONASE) 50 MCG/ACT nasal spray Place 2 sprays into both nostrils daily. 01/04/17   Palumbo, April, MD  guaiFENesin 200 MG tablet Take 1 tablet (200 mg total) by mouth every 4 (four) hours as needed for cough or to loosen phlegm. 01/04/17   Palumbo, April, MD  ibuprofen (ADVIL,MOTRIN) 600 MG tablet Take 1 tablet (600 mg total) by mouth every 6 (six) hours as needed. 03/18/17   Audry Pili, PA-C  lisinopril-hydrochlorothiazide (PRINZIDE,ZESTORETIC) 20-12.5 MG per tablet Take 1 tablet by mouth daily.    [provider]  loratadine (CLARITIN) 10 MG tablet Take 1 tablet (10 mg total) by mouth daily. 09/28/11 06/05/14  Dayton Bailiff, MD  methocarbamol (ROBAXIN) 500 MG tablet Take 1 tablet (500 mg total) by mouth 2 (two) times daily. 03/18/17   Audry Pili, PA-C  sildenafil (VIAGRA) 100 MG tablet Take 100 mg by mouth daily as needed for erectile dysfunction.  [provider]  Tadalafil (CIALIS PO) Take by mouth daily.    [provider]    Allergies    Patient has no known allergies.  Review of Systems   Review of Systems  Constitutional: Negative for chills and fever.  HENT: Negative for ear pain and sore throat.   Eyes: Positive for pain. Negative for visual disturbance.  Respiratory: Negative for cough and shortness of breath.   Cardiovascular: Negative for chest pain and palpitations.  Gastrointestinal: Negative for abdominal pain and vomiting.  Genitourinary: Negative for dysuria and hematuria.  Musculoskeletal: Negative for arthralgias and back pain.  Skin: Negative for color change and rash.    Neurological: Negative for seizures and syncope.  All other systems reviewed and are negative.   Physical Exam Updated Vital Signs BP (!) 156/112 Comment: pt states he did not take his BP medication this morning.  Pulse 72   Temp 97.8 F (36.6 C) (Oral)   Resp 16   Ht 6\' 1"  (1.854 m)   Wt 95.3 kg   SpO2 98%   BMI 27.72 kg/m   Physical Exam Vitals and nursing note reviewed.  Constitutional:      Appearance: He is well-developed.  HENT:     Head: Normocephalic and atraumatic.  Eyes:     General: Lids are normal. Lids are everted, no foreign bodies appreciated. Vision grossly intact. Gaze aligned appropriately. No visual field deficit or scleral icterus.       Right eye: No foreign body, discharge or hordeolum.        Left eye: No foreign body, discharge or hordeolum.     Intraocular pressure: Right eye pressure is 11 mmHg. Left eye pressure is 22 mmHg. Measurements were taken using a handheld tonometer.    Extraocular Movements: Extraocular movements intact.     Conjunctiva/sclera: Conjunctivae normal.     Right eye: Right conjunctiva is not injected. No chemosis, exudate or hemorrhage.    Left eye: Left conjunctiva is not injected. No chemosis, exudate or hemorrhage.    Comments: No uptake on floursein stain, no visualized foreign bodies  Cardiovascular:     Rate and Rhythm: Normal rate.     Pulses: Normal pulses.  Pulmonary:     Effort: Pulmonary effort is normal. No respiratory distress.  Musculoskeletal:     Cervical back: Neck supple.  Skin:    General: Skin is warm and dry.  Neurological:     General: No focal deficit present.     Mental Status: He is alert.     Comments: CN II-XII intact  Psychiatric:        Mood and Affect: Mood normal.        Behavior: Behavior normal.     ED Results / Procedures / Treatments   Labs (all labs ordered are listed, but only abnormal results are displayed) Labs Reviewed - No data to display  EKG None  Radiology No  results found.  Procedures Procedures (including critical care time)  Medications Ordered in ED Medications  tetracaine (PONTOCAINE) 0.5 % ophthalmic solution 2 drop (has no administration in time range)  fluorescein ophthalmic strip 1 strip (has no administration in time range)    ED Course  I have reviewed the triage vital signs and the nursing notes.  Pertinent labs & imaging results that were available during my care of the patient were reviewed by me and considered in my medical decision making (see chart for details).    MDM Rules/Calculators/A&P  54 year old male presents to ER with concern for foreign body sensation in his left eye.  He has no associated vision loss, has no associated neurologic symptoms.  On physical exam, well-appearing in no distress, pressures within normal limits, no visualized foreign body on careful inspection of eye, lids, no uptake of fluorescein stain.  EOM intact, visual fields intact.  Suspect patient may have had irritant or transient foreign body.  Given reassuring physical exam, do not feel patient warrants additional work-up in ER.  Recommend that he have close follow-up with his obstetrical office.  If they are unable to see him today, recommend that he follow-up with the on-call ophthalmologist.  Provided information.  Reviewed precautions and discharged home.   After the discussed management above, the patient was determined to be safe for discharge.  The patient was in agreement with this plan and all questions regarding their care were answered.  ED return precautions were discussed and the patient will return to the ED with any significant worsening of condition.  Final Clinical Impression(s) / ED Diagnoses Final diagnoses:  Sensation of foreign body in eye    Rx / DC Orders ED Discharge Orders    None       Milagros Loll, MD 02/13/20 6599    Milagros Loll, MD 02/13/20 [phone]

## 2020-07-09 ENCOUNTER — Other Ambulatory Visit: Payer: Self-pay

## 2020-07-09 ENCOUNTER — Emergency Department (HOSPITAL_BASED_OUTPATIENT_CLINIC_OR_DEPARTMENT_OTHER)
Admission: EM | Admit: 2020-07-09 | Discharge: 2020-07-09 | Disposition: A | Discharge status: Home / Self Care | Payer: No Typology Code available for payment source | Attending: Emergency Medicine | Admitting: Emergency Medicine

## 2020-07-09 ENCOUNTER — Emergency Department (HOSPITAL_BASED_OUTPATIENT_CLINIC_OR_DEPARTMENT_OTHER): Payer: No Typology Code available for payment source

## 2020-07-09 ENCOUNTER — Encounter (HOSPITAL_BASED_OUTPATIENT_CLINIC_OR_DEPARTMENT_OTHER): Payer: Self-pay | Admitting: *Deleted

## 2020-07-09 DIAGNOSIS — J069 Acute upper respiratory infection, unspecified: Secondary | ICD-10-CM | POA: Diagnosis not present

## 2020-07-09 DIAGNOSIS — Z79899 Other long term (current) drug therapy: Secondary | ICD-10-CM | POA: Insufficient documentation

## 2020-07-09 DIAGNOSIS — R059 Cough, unspecified: Secondary | ICD-10-CM | POA: Diagnosis present

## 2020-07-09 DIAGNOSIS — R0989 Other specified symptoms and signs involving the circulatory and respiratory systems: Secondary | ICD-10-CM | POA: Diagnosis not present

## 2020-07-09 DIAGNOSIS — Z20822 Contact with and (suspected) exposure to covid-19: Secondary | ICD-10-CM | POA: Insufficient documentation

## 2020-07-09 DIAGNOSIS — I10 Essential (primary) hypertension: Secondary | ICD-10-CM | POA: Insufficient documentation

## 2020-07-09 LAB — GROUP A STREP BY PCR: Group A Strep by PCR: NOT DETECTED

## 2020-07-09 MED ORDER — BENZONATATE 100 MG PO CAPS: 100.0000 mg | ORAL_CAPSULE | Freq: Three times a day (TID) | ORAL | 0 refills | Status: DC

## 2020-07-09 MED ORDER — IBUPROFEN 600 MG PO TABS: 600.0000 mg | ORAL_TABLET | Freq: Three times a day (TID) | ORAL | 0 refills | Status: DC | PRN

## 2020-07-09 MED ORDER — GUAIFENESIN ER 1200 MG PO TB12: 1.0000 | ORAL_TABLET | Freq: Two times a day (BID) | ORAL | 0 refills | Status: DC

## 2020-07-09 NOTE — ED Triage Notes (Signed)
C/o cough and fever x 6 days.

## 2020-07-09 NOTE — ED Provider Notes (Signed)
[facility] EMERGENCY DEPARTMENT Provider Note   CSN: [id number] Arrival date & time: 07/09/20  2142     History Chief Complaint  Patient presents with  . Cough    Gregory Harvey is a 55 y.o. male.  HPI   Patient presents ED for evaluation of cough and congestion.  Patient states symptoms started several days ago.  He has had trouble with a cough and has felt feverish.  He has had nasal congestion and congestion in his chest.  Patient also started to have sore throat.  It hurts to swallow.  No vomiting or diarrhea.  No shortness of breath.  Patient has been vaccinated and boosted for COVID-19  Past Medical History:  Diagnosis Date  . Ganglion cyst of wrist 09/2012   recurrent - right wrist  . Hypertension    under control with med., has been on med. x 2 yr.  . Seasonal allergies   . TFCC (triangular fibrocartilage complex) tear 09/2012   right    There are no problems to display for this patient.   Past Surgical History:  Procedure Laterality Date  . CYST EXCISION Right    wrist  . KNEE ARTHROSCOPY Right   . ORCHIOPEXY    . WRIST ARTHROSCOPY Right 10/11/2012   Procedure: ARTHROSCOPY RIGHT WRIST POSSIBLE SHRINKAGE DEBRIDEMENT FELDON DOME;  Surgeon: Nicki Reaper, MD;  Location: [facility];  Service: Orthopedics;  Laterality: Right;       No family history on file.  Social History   Tobacco Use  . Smoking status: Never Smoker  . Smokeless tobacco: Never Used  Vaping Use  . Vaping Use: Never used  Substance Use Topics  . Alcohol use: No  . Drug use: No    Home Medications Prior to Admission medications   Medication Sig Start Date End Date Taking? Authorizing Provider  benzonatate (TESSALON) 100 MG capsule Take 1 capsule (100 mg total) by mouth every 8 (eight) hours. 07/09/20  Yes Linwood Dibbles, MD  Guaifenesin 1200 MG TB12 Take 1 tablet (1,200 mg total) by mouth 2 (two) times daily at 10 AM and 5 PM. 07/09/20  Yes Linwood Dibbles, MD   ibuprofen (ADVIL) 600 MG tablet Take 1 tablet (600 mg total) by mouth every 8 (eight) hours as needed. 07/09/20  Yes Linwood Dibbles, MD  albuterol (PROVENTIL HFA;VENTOLIN HFA) 108 (90 BASE) MCG/ACT inhaler Inhale 2 puffs into the lungs every 6 (six) hours as needed. For shortness of breath    [provider]  atorvastatin (LIPITOR) 10 MG tablet Take 10 mg by mouth daily.    [provider]  benzonatate (TESSALON) 100 MG capsule Take 1 capsule (100 mg total) by mouth every 8 (eight) hours. 07/09/20   Linwood Dibbles, MD  fexofenadine (ALLEGRA) 180 MG tablet Take 180 mg by mouth daily.    [provider]  fluticasone (FLONASE) 50 MCG/ACT nasal spray Place 2 sprays into both nostrils daily. 01/04/17   Palumbo, April, MD  lisinopril-hydrochlorothiazide (PRINZIDE,ZESTORETIC) 20-12.5 MG per tablet Take 1 tablet by mouth daily.    [provider]  loratadine (CLARITIN) 10 MG tablet Take 1 tablet (10 mg total) by mouth daily. 09/28/11 06/05/14  Dayton Bailiff, MD  methocarbamol (ROBAXIN) 500 MG tablet Take 1 tablet (500 mg total) by mouth 2 (two) times daily. 03/18/17   Audry Pili, PA-C  sildenafil (VIAGRA) 100 MG tablet Take 100 mg by mouth daily as needed for erectile dysfunction.    [provider]  Tadalafil (CIALIS PO) Take by mouth daily.    [provider]    Allergies    Patient has no known allergies.  Review of Systems   Review of Systems  All other systems reviewed and are negative.   Physical Exam Updated Vital Signs BP (!) 153/102   Pulse (!) 104   Temp 99.5 F (37.5 C) (Oral)   Resp 16   Ht 1.854 m (6\' 1" )   Wt 95.3 kg   SpO2 97%   BMI 27.71 kg/m   Physical Exam Vitals and nursing note reviewed.  Constitutional:      General: He is not in acute distress.    Appearance: He is well-developed and well-nourished.  HENT:     Head: Normocephalic and atraumatic.     Right Ear: External ear normal.     Left Ear: External ear normal.      Mouth/Throat:     Mouth: Mucous membranes are moist.     Pharynx: Posterior oropharyngeal erythema present. No oropharyngeal exudate.  Eyes:     General: No scleral icterus.       Right eye: No discharge.        Left eye: No discharge.     Conjunctiva/sclera: Conjunctivae normal.  Neck:     Trachea: No tracheal deviation.  Cardiovascular:     Rate and Rhythm: Normal rate and regular rhythm.     Pulses: Intact distal pulses.  Pulmonary:     Effort: Pulmonary effort is normal. No respiratory distress.     Breath sounds: Normal breath sounds. No stridor. No wheezing or rales.  Abdominal:     General: Bowel sounds are normal. There is no distension.     Palpations: Abdomen is soft.     Tenderness: There is no abdominal tenderness. There is no guarding or rebound.  Musculoskeletal:        General: No tenderness or edema.     Cervical back: Neck supple.  Skin:    General: Skin is warm and dry.     Findings: No rash.  Neurological:     Mental Status: He is alert.     Cranial Nerves: No cranial nerve deficit (no facial droop, extraocular movements intact, no slurred speech).     Sensory: No sensory deficit.     Motor: No abnormal muscle tone or seizure activity.     Coordination: Coordination normal.     Deep Tendon Reflexes: Strength normal.  Psychiatric:        Mood and Affect: Mood and affect normal.     ED Results / Procedures / Treatments   Labs (all labs ordered are listed, but only abnormal results are displayed) Labs Reviewed  GROUP A STREP BY PCR  SARS CORONAVIRUS 2 (TAT 6-24 HRS)    EKG None  Radiology DG Chest 2 View  Result Date: 07/09/2020 CLINICAL DATA:  Cough EXAM: CHEST - 2 VIEW COMPARISON:  01/04/2017 FINDINGS: The heart size and mediastinal contours are within normal limits. Both lungs are clear. Degenerative changes of the spine. IMPRESSION: No active cardiopulmonary disease. Electronically Signed   By: 01/06/2017 M.D.   On: 07/09/2020 22:38     Procedures Procedures   Medications Ordered in ED Medications - No data to display  ED Course  I have reviewed the triage vital signs and the nursing notes.  Pertinent labs & imaging results that were available during my care of the patient were reviewed by me and considered in my medical decision  making (see chart for details).  Clinical Course as of 07/09/20 2333  Wed Jul 09, 2020  2249 Chest x-ray negative for acute finding [JK]    Clinical Course User Index [JK] Linwood Dibbles, MD   MDM Rules/Calculators/A&P                          Chest x-ray without signs of pneumonia.  Strep test is negative.  Patient nontoxic.  No hypoxia or respiratory distress.  Patient has been vaccinated for COVID.  We will send off a test this evening.  Stable for discharge.  Symptomatic treatment. Final Clinical Impression(s) / ED Diagnoses Final diagnoses:  Viral URI with cough    Rx / DC Orders ED Discharge Orders         Ordered    benzonatate (TESSALON) 100 MG capsule  Every 8 hours        07/09/20 2331    benzonatate (TESSALON) 100 MG capsule  Every 8 hours        07/09/20 2331    Guaifenesin 1200 MG TB12  2 times daily        07/09/20 2331    ibuprofen (ADVIL) 600 MG tablet  Every 8 hours PRN        07/09/20 2331           Linwood Dibbles, MD 07/09/20 2333

## 2020-07-09 NOTE — Discharge Instructions (Addendum)
Take the medications as needed to help with your symptoms.  We also sent a Covid test this evening that should be available tomorrow.  Drink plenty fluids rest.

## 2020-07-10 LAB — SARS CORONAVIRUS 2 (TAT 6-24 HRS): SARS Coronavirus 2: NEGATIVE

## 2023-08-31 ENCOUNTER — Other Ambulatory Visit: Payer: Self-pay

## 2023-08-31 ENCOUNTER — Encounter (HOSPITAL_BASED_OUTPATIENT_CLINIC_OR_DEPARTMENT_OTHER): Payer: Self-pay | Admitting: Emergency Medicine

## 2023-08-31 ENCOUNTER — Emergency Department (HOSPITAL_BASED_OUTPATIENT_CLINIC_OR_DEPARTMENT_OTHER)
Admission: EM | Admit: 2023-08-31 | Discharge: 2023-08-31 | Disposition: A | Discharge status: Home / Self Care | Payer: Self-pay | Attending: Emergency Medicine | Admitting: Emergency Medicine

## 2023-08-31 ENCOUNTER — Emergency Department (HOSPITAL_BASED_OUTPATIENT_CLINIC_OR_DEPARTMENT_OTHER)

## 2023-08-31 DIAGNOSIS — Z79899 Other long term (current) drug therapy: Secondary | ICD-10-CM | POA: Diagnosis not present

## 2023-08-31 DIAGNOSIS — I82401 Acute embolism and thrombosis of unspecified deep veins of right lower extremity: Secondary | ICD-10-CM | POA: Insufficient documentation

## 2023-08-31 DIAGNOSIS — M79661 Pain in right lower leg: Secondary | ICD-10-CM | POA: Diagnosis present

## 2023-08-31 DIAGNOSIS — Z7901 Long term (current) use of anticoagulants: Secondary | ICD-10-CM | POA: Diagnosis not present

## 2023-08-31 DIAGNOSIS — I10 Essential (primary) hypertension: Secondary | ICD-10-CM | POA: Insufficient documentation

## 2023-08-31 MED ORDER — APIXABAN 5 MG PO TABS: ORAL_TABLET | ORAL | 0 refills | Status: DC

## 2023-08-31 NOTE — ED Provider Notes (Signed)
 [place] [facility] Provider Note   CSN: [id number] Arrival date & time: 08/31/23  1236     History  Chief Complaint  Patient presents with   Leg Pain    Gregory Harvey is a 58 y.o. male with history of hypertension, presents with concern for right calf pain that started earlier this morning.  States it feels like he pulled his calf muscle, but did not actually injure it.  Denies any numbness or tingling in his feet.  Denies any chest pain or shortness of breath.   Leg Pain      Home Medications Prior to Admission medications   Medication Sig Start Date End Date Taking? Authorizing Provider  albuterol (PROVENTIL HFA;VENTOLIN HFA) 108 (90 BASE) MCG/ACT inhaler Inhale 2 puffs into the lungs every 6 (six) hours as needed. For shortness of breath    [provider]  apixaban (ELIQUIS) 5 MG TABS tablet Take 2 tablets (10mg ) twice daily for 7 days, then 1 tablet (5mg ) twice daily 08/31/23   Arabella Merles, PA-C  atorvastatin (LIPITOR) 10 MG tablet Take 10 mg by mouth daily.    [provider]  benzonatate (TESSALON) 100 MG capsule Take 1 capsule (100 mg total) by mouth every 8 (eight) hours. 07/09/20   Linwood Dibbles, MD  benzonatate (TESSALON) 100 MG capsule Take 1 capsule (100 mg total) by mouth every 8 (eight) hours. 07/09/20   Linwood Dibbles, MD  fexofenadine (ALLEGRA) 180 MG tablet Take 180 mg by mouth daily.    [provider]  fluticasone (FLONASE) 50 MCG/ACT nasal spray Place 2 sprays into both nostrils daily. 01/04/17   Palumbo, April, MD  Guaifenesin 1200 MG TB12 Take 1 tablet (1,200 mg total) by mouth 2 (two) times daily at 10 AM and 5 PM. 07/09/20   Linwood Dibbles, MD  ibuprofen (ADVIL) 600 MG tablet Take 1 tablet (600 mg total) by mouth every 8 (eight) hours as needed. 07/09/20   Linwood Dibbles, MD  lisinopril-hydrochlorothiazide (PRINZIDE,ZESTORETIC) 20-12.5 MG per tablet Take 1 tablet by mouth daily.    [provider]  loratadine (CLARITIN) 10 MG tablet Take 1 tablet (10 mg total) by mouth daily. 09/28/11 06/05/14  Dayton Bailiff, MD  methocarbamol (ROBAXIN) 500 MG tablet Take 1 tablet (500 mg total) by mouth 2 (two) times daily. 03/18/17   Audry Pili, PA-C  sildenafil (VIAGRA) 100 MG tablet Take 100 mg by mouth daily as needed for erectile dysfunction.    [provider]  Tadalafil (CIALIS PO) Take by mouth daily.    [provider]      Allergies    Patient has no known allergies.    Review of Systems   Review of Systems  Musculoskeletal:        Left calf pain    Physical Exam Updated Vital Signs BP (!) 179/125   Pulse 90   Temp 98.3 F (36.8 C) (Oral)   Resp (!) 21   Wt 99.8 kg   SpO2 99%   BMI 29.03 kg/m  Physical Exam Vitals and nursing note reviewed.  Constitutional:      Appearance: Normal appearance.  HENT:     Head: Atraumatic.  Cardiovascular:     Rate and Rhythm: Normal rate and regular rhythm.     Comments: Dorsalis pedis pulse 2+ bilaterally  Cap refill less than 2 seconds in the toes of the right foot Pulmonary:     Effort: Pulmonary effort is normal.  Breath sounds: Normal breath sounds.  Musculoskeletal:     Comments: Right calf with mild tenderness to palpation.  Does not appear grossly swollen, calfs appear symmetric bilaterally.  Neurological:     General: No focal deficit present.     Mental Status: He is alert.     Comments: Intact sensation in the feet bilaterally  Psychiatric:        Mood and Affect: Mood normal.        Behavior: Behavior normal.     ED Results / Procedures / Treatments   Labs (all labs ordered are listed, but only abnormal results are displayed) Labs Reviewed - No data to display  EKG None  Radiology US Venous Img Lower Unilateral Right Result Date: 08/31/2023 CLINICAL DATA:  Right calf pain since yesterday. EXAM: RIGHT LOWER EXTREMITY VENOUS DOPPLER ULTRASOUND TECHNIQUE: Gray-scale sonography  with graded compression, as well as color Doppler and duplex ultrasound were performed to evaluate the lower extremity deep venous systems from the level of the common femoral vein and including the common femoral, femoral, profunda femoral, popliteal and calf veins including the posterior tibial, peroneal and gastrocnemius veins when visible. The superficial great saphenous vein was also interrogated. Spectral Doppler was utilized to evaluate flow at rest and with distal augmentation maneuvers in the common femoral, femoral and popliteal veins. COMPARISON:  None available FINDINGS: Contralateral Common Femoral Vein: Respiratory phasicity is normal and symmetric with the symptomatic side. No evidence of thrombus. Normal compressibility. Common Femoral Vein: No evidence of thrombus. Normal compressibility, respiratory phasicity and response to augmentation. Saphenofemoral Junction: No evidence of thrombus. Normal compressibility and flow on color Doppler imaging. Profunda Femoral Vein: No evidence of thrombus. Normal compressibility and flow on color Doppler imaging. Femoral Vein: No evidence of thrombus. Normal compressibility, respiratory phasicity and response to augmentation. Popliteal Vein: Intraluminal filling defect seen within the right popliteal vein resulting in decreased flow and compressibility, consistent with acute DVT. Calf Veins: Intraluminal filling defect seen within the peroneal vein resulting in decreased flow and compressibility, consistent with acute DVT. Posterior tibial vein is patent. Superficial Great Saphenous Vein: No evidence of thrombus. Normal compressibility. Venous Reflux:  None. Other Findings:  None. IMPRESSION: Acute DVT of the right popliteal and peroneal veins. Electronically Signed   By: Acquanetta Belling M.D.   On: 08/31/2023 14:41    Procedures Procedures    Medications Ordered in ED Medications - No data to display  ED Course/ Medical Decision Making/ A&P                                  Medical Decision Making    Differential diagnosis includes but is not limited to muscle strain, fracture, DVT, cellulitis  ED Course:  Upon initial evaluation, patient is well-appearing, stable vitals aside from elevated blood pressure of 179/125.  Right calf mildly tender to palpation.  No obvious edema.  Neurovascularly intact in the bilateral lower extremities.  It appears he has DVT of the right popliteal and peroneal veins.  No sign of occlusion on ultrasound and patient with palpable lower extremity pulses.  No concern for PE at this time given no shortness of breath, chest pain, or tachycardia.   Imaging Studies ordered: I ordered imaging studies including right lower extremity ultrasound I independently visualized the imaging with scope of interpretation limited to determining acute life threatening conditions related to emergency care. Imaging showed DVT of the right popliteal  and peroneal veins I agree with the radiologist interpretation     Impression: DVT of right popliteal and peroneal veins  Disposition:  The patient was discharged home with instructions to take Eliquis as prescribed.  Follow-up with DVT clinic, referral placed. Return precautions given.     This chart was dictated using voice recognition software, Dragon. Despite the best efforts of this provider to proofread and correct errors, errors may still occur which can change documentation meaning.          Final Clinical Impression(s) / ED Diagnoses Final diagnoses:  Acute deep vein thrombosis (DVT) of right lower extremity, unspecified vein (HCC)    Rx / DC Orders ED Discharge Orders          Ordered    AMB Referral to Deep Vein Thrombosis Clinic        08/31/23 1534    apixaban (ELIQUIS) 5 MG TABS tablet  Status:  Discontinued        08/31/23 1534    apixaban (ELIQUIS) 5 MG TABS tablet        08/31/23 1537              Rexie Catena, PA-C 08/31/23  1555    Afton Horse T, DO 09/01/23 0725

## 2023-08-31 NOTE — ED Triage Notes (Signed)
 Right calf pain x last night . Obvious discomfort with ambulation . No swelling noted . Denies shortness of breath or chest pain

## 2023-08-31 NOTE — Discharge Instructions (Addendum)
 You were found to have a blood clot in your right calf today.  I have included your ultrasound results below for your reference.  You will need to follow-up with the DVT (blood clot) clinic.  I have placed a referral for you, they should be calling you to schedule an appointment.  You have been prescribed a blood thinner called Eliquis.  Please take this as prescribed.  Your blood pressure was very elevated today at 179/125.  Please follow-up with your PCP within the next week for recheck of your blood pressure and further management of your blood pressures.  Return to the ER for any worsening pain, numbness in your foot, chest pain, shortness of breath, any other new or concerning symptoms.  "EXAM: RIGHT LOWER EXTREMITY VENOUS DOPPLER ULTRASOUND   TECHNIQUE: Gray-scale sonography with graded compression, as well as color Doppler and duplex ultrasound were performed to evaluate the lower extremity deep venous systems from the level of the common femoral vein and including the common femoral, femoral, profunda femoral, popliteal and calf veins including the posterior tibial, peroneal and gastrocnemius veins when visible. The superficial great saphenous vein was also interrogated. Spectral Doppler was utilized to evaluate flow at rest and with distal augmentation maneuvers in the common femoral, femoral and popliteal veins.   COMPARISON:  None available   FINDINGS: Contralateral Common Femoral Vein: Respiratory phasicity is normal and symmetric with the symptomatic side. No evidence of thrombus. Normal compressibility.   Common Femoral Vein: No evidence of thrombus. Normal compressibility, respiratory phasicity and response to augmentation.   Saphenofemoral Junction: No evidence of thrombus. Normal compressibility and flow on color Doppler imaging.   Profunda Femoral Vein: No evidence of thrombus. Normal compressibility and flow on color Doppler imaging.   Femoral Vein: No  evidence of thrombus. Normal compressibility, respiratory phasicity and response to augmentation.   Popliteal Vein: Intraluminal filling defect seen within the right popliteal vein resulting in decreased flow and compressibility, consistent with acute DVT.   Calf Veins: Intraluminal filling defect seen within the peroneal vein resulting in decreased flow and compressibility, consistent with acute DVT. Posterior tibial vein is patent.   Superficial Great Saphenous Vein: No evidence of thrombus. Normal compressibility.   Venous Reflux:  None.   Other Findings:  None.   IMPRESSION: Acute DVT of the right popliteal and peroneal veins.     Electronically Signed   By: Elester Grim M.D.   On: 08/31/2023 14:41"

## 2023-09-06 ENCOUNTER — Encounter (HOSPITAL_COMMUNITY)

## 2023-09-14 ENCOUNTER — Ambulatory Visit: Attending: Student-PharmD | Admitting: Student-PharmD

## 2023-09-14 NOTE — Progress Notes (Deleted)
 DVT Clinic Note  Name: Gregory Harvey     MRN: [id number]     DOB: Dec 28, 1965     Sex: male  PCP: Sheilah Denver., MD  Today's Visit:    Referred to DVT Clinic by: Emergency Department - Rexie Catena, PA-C Referred to CPP by: {CPP Referral Provider:[id number]} Reason for referral: No chief complaint on file.  HISTORY OF PRESENT ILLNESS: Gregory Harvey is a 58 y.o. 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 Patient was seen in the ED 08/31/23 for new right calf pain, found to have acute DVT in the right popliteal and peroneal veins. He was discharged on Eliquis  and referred to DVT Clinic. Patient went to his pharmacy to fill Eliquis , when he was told there was an interaction with Eliquis  and Brilinta, which he did not report taking to the ED. He called his cardiologist at [facility] who had him stop taking Brilinta and he was able to start taking Eliquis .    Today patient reports ***. *** abnormal bleeding or bruising. *** missed doses of ***. *** wearing compression stockings.            Rx Insurance Coverage: {Insurance WUJW:[id number]} Rx Affordability: *** Rx Assistance Provided: {Rx Assistance Provided:[id number]} Preferred Pharmacy: ***  Past Medical History:  Diagnosis Date   Ganglion cyst of wrist 09/2012   recurrent - right wrist   Hypertension    under control with med., has been on med. x 2 yr.   Seasonal allergies    TFCC (triangular fibrocartilage complex) tear 09/2012   right    Past Surgical History:  Procedure Laterality Date   CYST EXCISION Right    wrist   KNEE ARTHROSCOPY Right    ORCHIOPEXY     WRIST ARTHROSCOPY Right 10/11/2012   Procedure: ARTHROSCOPY RIGHT WRIST POSSIBLE SHRINKAGE DEBRIDEMENT FELDON DOME;  Surgeon: Kemp Patter, MD;  Location: [facility];  Service: Orthopedics;  Laterality: Right;    Social History   Socioeconomic History   Marital status: Married    Spouse  name: Not on file   Number of children: Not on file   Years of education: Not on file   Highest education level: Not on file  Occupational History   Not on file  Tobacco Use   Smoking status: Never   Smokeless tobacco: Never  Vaping Use   Vaping status: Never Used  Substance and Sexual Activity   Alcohol use: No   Drug use: No   Sexual activity: Not on file  Other Topics Concern   Not on file  Social History Narrative   Not on file   Social Drivers of Health   Financial Resource Strain: Not on file  Food Insecurity: Low Risk  (12/02/2022)   Received from [facility]   Hunger Vital Sign    Worried About Running Out of Food in the Last Year: Never true    Ran Out of Food in the Last Year: Never true  Transportation Needs: No Transportation Needs (12/02/2022)   Received from [facility]    In the past 12 months, has lack of reliable transportation kept you from medical appointments, meetings, work or from getting things needed for daily living? : No  Physical Activity: Not on file  Stress: Not on file  Social Connections: Not on file  Intimate Partner Violence: Not on file    No family history on file.  Allergies  as of 09/14/2023   (No Known Allergies)    Current Outpatient Medications on File Prior to Visit  Medication Sig Dispense Refill   albuterol  (PROVENTIL  HFA;VENTOLIN  HFA) 108 (90 BASE) MCG/ACT inhaler Inhale 2 puffs into the lungs every 6 (six) hours as needed. For shortness of breath     apixaban  (ELIQUIS ) 5 MG TABS tablet Take 2 tablets (10mg ) twice daily for 7 days, then 1 tablet (5mg ) twice daily 60 tablet 0   atorvastatin (LIPITOR) 10 MG tablet Take 10 mg by mouth daily.     benzonatate  (TESSALON ) 100 MG capsule Take 1 capsule (100 mg total) by mouth every 8 (eight) hours. 21 capsule 0   benzonatate  (TESSALON ) 100 MG capsule Take 1 capsule (100 mg total) by mouth every 8 (eight) hours. 21 capsule 0   fexofenadine (ALLEGRA) 180 MG tablet  Take 180 mg by mouth daily.     fluticasone  (FLONASE ) 50 MCG/ACT nasal spray Place 2 sprays into both nostrils daily. 16 g 0   Guaifenesin  1200 MG TB12 Take 1 tablet (1,200 mg total) by mouth 2 (two) times daily at 10 AM and 5 PM. 20 tablet 0   ibuprofen  (ADVIL ) 600 MG tablet Take 1 tablet (600 mg total) by mouth every 8 (eight) hours as needed. 21 tablet 0   lisinopril-hydrochlorothiazide (PRINZIDE,ZESTORETIC) 20-12.5 MG per tablet Take 1 tablet by mouth daily.     loratadine  (CLARITIN ) 10 MG tablet Take 1 tablet (10 mg total) by mouth daily. 30 tablet 0   methocarbamol  (ROBAXIN ) 500 MG tablet Take 1 tablet (500 mg total) by mouth 2 (two) times daily. 20 tablet 0   sildenafil (VIAGRA) 100 MG tablet Take 100 mg by mouth daily as needed for erectile dysfunction.     Tadalafil (CIALIS PO) Take by mouth daily.     No current facility-administered medications on file prior to visit.   REVIEW OF SYSTEMS:  ROS PHYSICAL EXAMINATION:  There were no vitals filed for this visit.  There is no height or weight on file to calculate BMI.  Physical Exam Villalta Score for Post-Thrombotic Syndrome:    LABS:  CBC     Component Value Date/Time   WBC 5.3 09/28/2011 2243   RBC 5.86 (H) 09/28/2011 2243   HGB 16.6 10/11/2012 0932   HCT 48.6 09/28/2011 2243   PLT 254 09/28/2011 2243   MCV 82.9 09/28/2011 2243   MCH 29.4 09/28/2011 2243   MCHC 35.4 09/28/2011 2243   RDW 12.3 09/28/2011 2243   LYMPHSABS 1.8 09/28/2011 2243   MONOABS 0.4 09/28/2011 2243   EOSABS 0.1 09/28/2011 2243   BASOSABS 0.0 09/28/2011 2243    Hepatic Function   No results found for: "PROT", "ALBUMIN", "AST", "ALT", "ALKPHOS", "BILITOT", "BILIDIR", "IBILI"  Renal Function   Lab Results  Component Value Date   CREATININE 1.14 10/10/2012   CREATININE 1.20 09/28/2011    CrCl cannot be calculated (Patient's most recent lab result is older than the maximum 21 days allowed.).   [facility] Vascular Lab Studies:  08/31/23 DVT study   FINDINGS: Contralateral Common Femoral Vein: Respiratory phasicity is normal and symmetric with the symptomatic side. No evidence of thrombus. Normal compressibility.   Common Femoral Vein: No evidence of thrombus. Normal compressibility, respiratory phasicity and response to augmentation.   Saphenofemoral Junction: No evidence of thrombus. Normal compressibility and flow on color Doppler imaging.   Profunda Femoral Vein: No evidence of thrombus. Normal compressibility and flow on color Doppler imaging.   Femoral Vein: No  evidence of thrombus. Normal compressibility, respiratory phasicity and response to augmentation.   Popliteal Vein: Intraluminal filling defect seen within the right popliteal vein resulting in decreased flow and compressibility, consistent with acute DVT.   Calf Veins: Intraluminal filling defect seen within the peroneal vein resulting in decreased flow and compressibility, consistent with acute DVT. Posterior tibial vein is patent.   Superficial Great Saphenous Vein: No evidence of thrombus. Normal compressibility.   Venous Reflux:  None.   Other Findings:  None.   IMPRESSION: Acute DVT of the right popliteal and peroneal veins.  ASSESSMENT:    PLAN: {DVT Clinic ZOXW:96045}  Follow up: ***  Faye Hoops, PharmD, BCACP, CPP Deep Vein Thrombosis Clinic Clinical Pharmacist Practitioner

## 2023-09-26 NOTE — Progress Notes (Deleted)
 DVT Clinic Note  Name: Gregory Harvey     MRN: [id number]     DOB: 23-Oct-1965     Sex: male  PCP: Sheilah Denver., MD  Today's Visit:    Referred to DVT Clinic by: Emergency Department - Gregory Catena, PA-C Referred to CPP by: {CPP Referral Provider:[id number]} Reason for referral: No chief complaint on file.  HISTORY OF PRESENT ILLNESS: Gregory Harvey is a 58 y.o. 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 Patient was seen in the ED 08/31/23 for new right calf pain, found to have acute DVT in the right popliteal and peroneal veins. He was discharged on Eliquis  and referred to DVT Clinic. Patient went to his pharmacy to fill Eliquis , when he was told there was an interaction with Eliquis  and Brilinta, which he did not report taking to the ED. He called his cardiologist at [facility] who had him *** stop taking Brilinta and he was able to start taking Eliquis .    Today patient reports ***. *** abnormal bleeding or bruising. *** missed doses of ***. *** wearing compression stockings.            Rx Insurance Coverage: {Insurance MWNU:[id number]} Rx Affordability: *** Rx Assistance Provided: {Rx Assistance Provided:[id number]} Preferred Pharmacy: ***  Past Medical History:  Diagnosis Date   Ganglion cyst of wrist 09/2012   recurrent - right wrist   Hypertension    under control with med., has been on med. x 2 yr.   Seasonal allergies    TFCC (triangular fibrocartilage complex) tear 09/2012   right    Past Surgical History:  Procedure Laterality Date   CYST EXCISION Right    wrist   KNEE ARTHROSCOPY Right    ORCHIOPEXY     WRIST ARTHROSCOPY Right 10/11/2012   Procedure: ARTHROSCOPY RIGHT WRIST POSSIBLE SHRINKAGE DEBRIDEMENT FELDON DOME;  Surgeon: Kemp Patter, MD;  Location: [facility];  Service: Orthopedics;  Laterality: Right;    Social History   Socioeconomic History   Marital status: Married    Spouse  name: Not on file   Number of children: Not on file   Years of education: Not on file   Highest education level: Not on file  Occupational History   Not on file  Tobacco Use   Smoking status: Never   Smokeless tobacco: Never  Vaping Use   Vaping status: Never Used  Substance and Sexual Activity   Alcohol use: No   Drug use: No   Sexual activity: Not on file  Other Topics Concern   Not on file  Social History Narrative   Not on file   Social Drivers of Health   Financial Resource Strain: Not on file  Food Insecurity: Low Risk  (12/02/2022)   Received from [facility]   Hunger Vital Sign    Worried About Running Out of Food in the Last Year: Never true    Ran Out of Food in the Last Year: Never true  Transportation Needs: No Transportation Needs (12/02/2022)   Received from [facility]    In the past 12 months, has lack of reliable transportation kept you from medical appointments, meetings, work or from getting things needed for daily living? : No  Physical Activity: Not on file  Stress: Not on file  Social Connections: Not on file  Intimate Partner Violence: Not on file    No family history on file.  Allergies as of 09/27/2023   (No Known Allergies)    Current Outpatient Medications on File Prior to Visit  Medication Sig Dispense Refill   albuterol  (PROVENTIL  HFA;VENTOLIN  HFA) 108 (90 BASE) MCG/ACT inhaler Inhale 2 puffs into the lungs every 6 (six) hours as needed. For shortness of breath     apixaban  (ELIQUIS ) 5 MG TABS tablet Take 2 tablets (10mg ) twice daily for 7 days, then 1 tablet (5mg ) twice daily 60 tablet 0   atorvastatin (LIPITOR) 10 MG tablet Take 10 mg by mouth daily.     benzonatate  (TESSALON ) 100 MG capsule Take 1 capsule (100 mg total) by mouth every 8 (eight) hours. 21 capsule 0   benzonatate  (TESSALON ) 100 MG capsule Take 1 capsule (100 mg total) by mouth every 8 (eight) hours. 21 capsule 0   fexofenadine (ALLEGRA) 180 MG tablet  Take 180 mg by mouth daily.     fluticasone  (FLONASE ) 50 MCG/ACT nasal spray Place 2 sprays into both nostrils daily. 16 g 0   Guaifenesin  1200 MG TB12 Take 1 tablet (1,200 mg total) by mouth 2 (two) times daily at 10 AM and 5 PM. 20 tablet 0   ibuprofen  (ADVIL ) 600 MG tablet Take 1 tablet (600 mg total) by mouth every 8 (eight) hours as needed. 21 tablet 0   lisinopril-hydrochlorothiazide (PRINZIDE,ZESTORETIC) 20-12.5 MG per tablet Take 1 tablet by mouth daily.     loratadine  (CLARITIN ) 10 MG tablet Take 1 tablet (10 mg total) by mouth daily. 30 tablet 0   methocarbamol  (ROBAXIN ) 500 MG tablet Take 1 tablet (500 mg total) by mouth 2 (two) times daily. 20 tablet 0   sildenafil (VIAGRA) 100 MG tablet Take 100 mg by mouth daily as needed for erectile dysfunction.     Tadalafil (CIALIS PO) Take by mouth daily.     No current facility-administered medications on file prior to visit.   REVIEW OF SYSTEMS:  ROS PHYSICAL EXAMINATION:  There were no vitals filed for this visit.  There is no height or weight on file to calculate BMI.  Physical Exam Villalta Score for Post-Thrombotic Syndrome:    LABS:  CBC     Component Value Date/Time   WBC 5.3 09/28/2011 2243   RBC 5.86 (H) 09/28/2011 2243   HGB 16.6 10/11/2012 0932   HCT 48.6 09/28/2011 2243   PLT 254 09/28/2011 2243   MCV 82.9 09/28/2011 2243   MCH 29.4 09/28/2011 2243   MCHC 35.4 09/28/2011 2243   RDW 12.3 09/28/2011 2243   LYMPHSABS 1.8 09/28/2011 2243   MONOABS 0.4 09/28/2011 2243   EOSABS 0.1 09/28/2011 2243   BASOSABS 0.0 09/28/2011 2243    Hepatic Function   No results found for: "PROT", "ALBUMIN", "AST", "ALT", "ALKPHOS", "BILITOT", "BILIDIR", "IBILI"  Renal Function   Lab Results  Component Value Date   CREATININE 1.14 10/10/2012   CREATININE 1.20 09/28/2011    CrCl cannot be calculated (Patient's most recent lab result is older than the maximum 21 days allowed.).   VVS Vascular Lab Studies:  08/31/23 DVT study   FINDINGS: Contralateral Common Femoral Vein: Respiratory phasicity is normal and symmetric with the symptomatic side. No evidence of thrombus. Normal compressibility.   Common Femoral Vein: No evidence of thrombus. Normal compressibility, respiratory phasicity and response to augmentation.   Saphenofemoral Junction: No evidence of thrombus. Normal compressibility and flow on color Doppler imaging.   Profunda Femoral Vein: No evidence of thrombus. Normal compressibility and flow on color Doppler imaging.   Femoral Vein:  No evidence of thrombus. Normal compressibility, respiratory phasicity and response to augmentation.   Popliteal Vein: Intraluminal filling defect seen within the right popliteal vein resulting in decreased flow and compressibility, consistent with acute DVT.   Calf Veins: Intraluminal filling defect seen within the peroneal vein resulting in decreased flow and compressibility, consistent with acute DVT. Posterior tibial vein is patent.   Superficial Great Saphenous Vein: No evidence of thrombus. Normal compressibility.   Venous Reflux:  None.   Other Findings:  None.   IMPRESSION: Acute DVT of the right popliteal and peroneal veins.  ASSESSMENT:    PLAN: {DVT Clinic WJXB:[id number]}  Follow up: ***  Faye Hoops, PharmD, BCACP, CPP [facility] Clinical Pharmacist Practitioner

## 2023-09-27 ENCOUNTER — Ambulatory Visit: Attending: Vascular Surgery | Admitting: Student-PharmD

## 2023-10-14 NOTE — Progress Notes (Signed)
 DVT Clinic Note  Name: Gregory Harvey     MRN: [id number]     DOB: 1965/09/11     Sex: male  PCP: Sheilah Denver., MD  Today's Visit: Visit Information: Initial Visit  Referred to DVT Clinic by: Emergency Department - Rexie Catena, PA-C Referred to CPP by: Dr. Edgardo Goodwill Reason for referral:  Chief Complaint  Patient presents with   Med Management - DVT   HISTORY OF PRESENT ILLNESS: Gregory Harvey is a 58 y.o. 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 Patient was seen in the ED 08/31/23 for new right calf pain, found to have acute DVT in the right popliteal and peroneal veins. He was discharged on Eliquis  and referred to DVT Clinic. Patient went to his pharmacy to fill Eliquis , when he was told there was an interaction with Eliquis  and Brilinta, which he did not report taking to the ED staff. He called his cardiologist at [facility] who instructed him to stop taking Brilinta and he was to start taking Eliquis  but it was out of stock, so his PCP's office sent Xarelto in but it was also out of stock. He ultimately started on Xarelto on 09/14/23, two weeks after DVT diagnosis. He was initially scheduled in DVT Clinic immediately following his ED visit but has no showed or rescheduled visits multiple times leading to a delay in establishing care for DVT.   Today patient reports that the pain in his right leg has resolved. He endorses bilateral lower extremity edema that is at his baseline. 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 Denies abnormal bleeding or bruising. He is currently taking Xarelto 20 mg twice daily due to confusion from switching from the starter pack to the refill bottle. Denies missed doses of Xarelto. Denies wearing compression stockings but is open to purchasing some. No personal or family history of DVT. He drove to [place], [place]  8-10 hours  at the end of March and returned the beginning of April. RLE pain and cramping began on 08/30/23. He is up to date on cancer screenings.   Positive Thrombotic Risk Factors: Sedentary journey lasting >8 hours within 4 weeks Bleeding Risk Factors: Anticoagulant therapy, Antiplatelet therapy  Negative Thrombotic Risk Factors: Previous VTE, Recent surgery (within 3 months), Recent trauma (within 3 months), Recent admission to hospital with acute illness (within 3 months), Paralysis, paresis, or recent plaster cast immobilization of lower extremity, Central venous catheterization, Bed rest >72 hours within 3 months, Pregnancy, Within 6 weeks postpartum, Recent cesarean section (within 3 months), Estrogen therapy, Testosterone therapy, Erythropoiesis-stimulating agent, Recent COVID diagnosis (within 3 months), Active cancer, Non-malignant, chronic inflammatory condition, Known thrombophilic condition, Smoking, Obesity, Older age  Rx Insurance Coverage: Commercial Rx Affordability: Xarelto is ~$50/month. Provided with activated copay card to reduce cost of refills to $10 per 30 or 90 day supply.  Rx Assistance Provided: Co-pay card Preferred Pharmacy: PCP has sent in 6 months of refills to his [facility] in [place].   Past Medical History:  Diagnosis Date   Ganglion cyst of wrist 09/2012   recurrent - right wrist   Hypertension    under control with med., has been on med. x 2 yr.   Seasonal allergies    TFCC (triangular fibrocartilage complex) tear 09/2012   right    Past Surgical History:  Procedure Laterality Date  CYST EXCISION Right    wrist   KNEE ARTHROSCOPY Right    ORCHIOPEXY     WRIST ARTHROSCOPY Right 10/11/2012   Procedure: ARTHROSCOPY RIGHT WRIST POSSIBLE SHRINKAGE DEBRIDEMENT FELDON DOME;  Surgeon: Kemp Patter, MD;  Location: [facility];  Service: Orthopedics;  Laterality: Right;    Social History   Socioeconomic History   Marital status: Married    Spouse name: Not on  file   Number of children: Not on file   Years of education: Not on file   Highest education level: Not on file  Occupational History   Not on file  Tobacco Use   Smoking status: Never   Smokeless tobacco: Never  Vaping Use   Vaping status: Never Used  Substance and Sexual Activity   Alcohol use: No   Drug use: No   Sexual activity: Not on file  Other Topics Concern   Not on file  Social History Narrative   Not on file   Social Drivers of Health   Financial Resource Strain: Not on file  Food Insecurity: Low Risk  (09/21/2023)   Received from [facility]   Hunger Vital Sign    Worried About Running Out of Food in the Last Year: Never true    Ran Out of Food in the Last Year: Never true  Transportation Needs: No Transportation Needs (09/21/2023)   Received from [facility]    In the past 12 months, has lack of reliable transportation kept you from medical appointments, meetings, work or from getting things needed for daily living? : No  Physical Activity: Not on file  Stress: Not on file  Social Connections: Not on file  Intimate Partner Violence: Not on file    History reviewed. No pertinent family history.  Allergies as of 10/18/2023   (No Known Allergies)    Current Outpatient Medications on File Prior to Visit  Medication Sig Dispense Refill   acetaminophen  (TYLENOL ) 500 MG tablet Take 500 mg by mouth every 6 (six) hours as needed for mild pain (pain score 1-3) or moderate pain (pain score 4-6).     amLODipine-olmesartan (AZOR) 10-40 MG tablet Take 1 tablet by mouth daily.     atorvastatin (LIPITOR) 80 MG tablet Take 80 mg by mouth at bedtime.     doxazosin (CARDURA) 2 MG tablet Take 2 mg by mouth at bedtime.     EQ ASPIRIN LOW DOSE 81 MG chewable tablet Chew 81 mg by mouth daily.     fexofenadine (ALLEGRA) 180 MG tablet Take 180 mg by mouth daily.     metFORMIN (GLUCOPHAGE-XR) 500 MG 24 hr tablet Take 500 mg by mouth 2 (two) times daily with a  meal.     rivaroxaban (XARELTO) 20 MG TABS tablet Take 20 mg by mouth daily with supper.     sildenafil (VIAGRA) 100 MG tablet Take 100 mg by mouth daily as needed for erectile dysfunction.     Vitamin D, Ergocalciferol, (DRISDOL) 1.25 MG (50000 UNIT) CAPS capsule Take 50,000 Units by mouth 2 (two) times a week.     albuterol  (PROVENTIL  HFA;VENTOLIN  HFA) 108 (90 BASE) MCG/ACT inhaler Inhale 2 puffs into the lungs every 6 (six) hours as needed. For shortness of breath     No current facility-administered medications on file prior to visit.   REVIEW OF SYSTEMS:  Review of Systems  Respiratory:  Negative for shortness of breath.   Cardiovascular:  Positive for leg swelling (bilateral, at  baseline). Negative for chest pain and palpitations.  Musculoskeletal:  Negative for myalgias.  Neurological:  Negative for dizziness and tingling.   PHYSICAL EXAMINATION:  Vitals:   10/18/23 1448  BP: (!) 137/99  Pulse: 94  SpO2: 96%  Weight: 236 lb 6.4 oz (107.2 kg)    Body mass index is 31.19 kg/m.  Physical Exam Vitals reviewed.  Cardiovascular:     Rate and Rhythm: Normal rate.  Pulmonary:     Effort: Pulmonary effort is normal.  Musculoskeletal:        General: Swelling (1+ bilateral) present. No tenderness.  Skin:    Findings: No bruising or erythema.  Psychiatric:        Mood and Affect: Mood normal.        Behavior: Behavior normal.        Thought Content: Thought content normal.   Villalta Score for Post-Thrombotic Syndrome: Pain: Absent Cramps: Absent Heaviness: Absent Paresthesia: Absent Pruritus: Absent Pretibial Edema: Mild Skin Induration: Absent Hyperpigmentation: Absent Redness: Absent Venous Ectasia: Absent Pain on calf compression: Absent Villalta Preliminary Score: 1 Is venous ulcer present?: No If venous ulcer is present and score is <15, then 15 points total are assigned: Absent Villalta Total Score: 1  LABS:  CBC     Component Value Date/Time   WBC 5.3  09/28/2011 2243   RBC 5.86 (H) 09/28/2011 2243   HGB 16.6 10/11/2012 0932   HCT 48.6 09/28/2011 2243   PLT 254 09/28/2011 2243   MCV 82.9 09/28/2011 2243   MCH 29.4 09/28/2011 2243   MCHC 35.4 09/28/2011 2243   RDW 12.3 09/28/2011 2243   LYMPHSABS 1.8 09/28/2011 2243   MONOABS 0.4 09/28/2011 2243   EOSABS 0.1 09/28/2011 2243   BASOSABS 0.0 09/28/2011 2243    Hepatic Function   No results found for: "PROT", "ALBUMIN", "AST", "ALT", "ALKPHOS", "BILITOT", "BILIDIR", "IBILI"  Renal Function   Lab Results  Component Value Date   CREATININE 1.14 10/10/2012   CREATININE 1.20 09/28/2011    CrCl cannot be calculated (Patient's most recent lab result is older than the maximum 21 days allowed.).   *More recent labs available in CareEverywhere.  VVS Vascular Lab Studies:  08/31/23 DVT study  FINDINGS: Contralateral Common Femoral Vein: Respiratory phasicity is normal and symmetric with the symptomatic side. No evidence of thrombus. Normal compressibility.   Common Femoral Vein: No evidence of thrombus. Normal compressibility, respiratory phasicity and response to augmentation.   Saphenofemoral Junction: No evidence of thrombus. Normal compressibility and flow on color Doppler imaging.   Profunda Femoral Vein: No evidence of thrombus. Normal compressibility and flow on color Doppler imaging.   Femoral Vein: No evidence of thrombus. Normal compressibility, respiratory phasicity and response to augmentation.   Popliteal Vein: Intraluminal filling defect seen within the right popliteal vein resulting in decreased flow and compressibility, consistent with acute DVT.   Calf Veins: Intraluminal filling defect seen within the peroneal vein resulting in decreased flow and compressibility, consistent with acute DVT. Posterior tibial vein is patent.   Superficial Great Saphenous Vein: No evidence of thrombus. Normal compressibility.   Venous Reflux:  None.   Other Findings:  None.    IMPRESSION: Acute DVT of the right popliteal and peroneal veins.  ASSESSMENT: Location of DVT: Right popliteal vein, Right distal vein Cause of DVT: provoked by a transient risk factor  Patient without prior history of DVT diagnosed with acute DVT in the right popliteal and peroneal veins on 08/31/23 s/p prolonged immobility (8-10 hour car  ride to [place]  and back) within the two weeks prior to symptom onset. Will treat his first provoked DVT with 3-6 months of anticoagulation. We discussed that he is currently taking Xarelto incorrectly, as he is taking 20 mg BID with or without food and he needs to take 20 mg daily and always with food. He confirms understanding. Provided him with copay card to make his refills more affordable. His PCP has already provided 6 months of refills to his pharmacy, so we will plan to finish 6 months of treatment and see him back at that time. Counseled patient on importance of proper compression and elevation. All questions have been answered at this time.   PLAN: -Continue rivaroxaban (Xarelto) 20 mg daily with food. -Expected duration of therapy: 6 months. Therapy started on 09/15/23. -Patient educated on purpose, proper use and potential adverse effects of rivaroxaban (Xarelto). -Discussed importance of taking medication around the same time every day. -Advised patient of medications to avoid (NSAIDs, aspirin doses >100 mg daily). -Educated that Tylenol  (acetaminophen ) is the preferred analgesic to lower the risk of bleeding. -Advised patient to alert all providers of anticoagulation therapy prior to starting a new medication or having a procedure. -Emphasized importance of monitoring for signs and symptoms of bleeding (abnormal bruising, prolonged bleeding, nose bleeds, bleeding from gums, discolored urine, black tarry stools). -Educated patient to present to the ED if emergent signs and symptoms of new thrombosis occur. -Counseled patient to wear compression  stockings daily, removing at night. Elevate legs as needed to improve swelling.   Follow up: November for end of treatment visit.   Faye Hoops, PharmD, [place], CPP [facility] Clinical Pharmacist Practitioner [phone]

## 2023-10-18 ENCOUNTER — Ambulatory Visit: Attending: Vascular Surgery | Admitting: Student-PharmD

## 2023-10-18 ENCOUNTER — Encounter: Payer: Self-pay | Admitting: Student-PharmD

## 2023-10-18 VITALS — BP 137/99 | HR 94 | Wt 236.4 lb

## 2023-10-18 DIAGNOSIS — I82431 Acute embolism and thrombosis of right popliteal vein: Secondary | ICD-10-CM

## 2023-10-18 NOTE — Patient Instructions (Signed)
-  Continue rivaroxaban (Xarelto) 20 mg daily with food. -Your refills have been sent to your [facility] by your primary provider. You may need to call the pharmacy to ask them to fill this when you start to run low on your current supply. Remember to use the coupon card I gave you to reduce your refills to $10.  -It is important to take your medication around the same time every day.  -Avoid NSAIDs like ibuprofen  (Advil , Motrin ) and naproxen  (Aleve ) as well as aspirin doses over 100 mg daily. -Tylenol  (acetaminophen ) is the preferred over the counter pain medication to lower the risk of bleeding. -Be sure to alert all of your health care providers that you are taking an anticoagulant prior to starting a new medication or having a procedure. -Monitor for signs and symptoms of bleeding (abnormal bruising, prolonged bleeding, nose bleeds, bleeding from gums, discolored urine, black tarry stools). If you have fallen and hit your head OR if your bleeding is severe or not stopping, seek emergency care.  -Go to the emergency room if emergent signs and symptoms of new clot occur (new or worse swelling and pain in an arm or leg, shortness of breath, chest pain, fast or irregular heartbeats, lightheadedness, dizziness, fainting, coughing up blood) or if you experience a significant color change (pale or blue) in the extremity that has the DVT.  -We recommend you wear compression stockings (20-30 mmHg) as long as you are having swelling or pain. Be sure to purchase the correct size and take them off at night.   If you have any questions or need to reschedule an appointment, please call [phone]. If you are having an emergency, call 911 or present to the nearest emergency room.   What is a DVT?  -Deep vein thrombosis (DVT) is a condition in which a blood clot forms in a vein of the deep venous system which can occur in the lower leg, thigh, pelvis, arm, or neck. This condition is serious and can be  life-threatening if the clot travels to the arteries of the lungs and causing a blockage (pulmonary embolism, PE). A DVT can also damage veins in the leg, which can lead to long-term venous disease, leg pain, swelling, discoloration, and ulcers or sores (post-thrombotic syndrome).  -Treatment may include taking an anticoagulant medication to prevent more clots from forming and the current clot from growing, wearing compression stockings, and/or surgical procedures to remove or dissolve the clot.

## 2024-02-28 ENCOUNTER — Encounter (HOSPITAL_BASED_OUTPATIENT_CLINIC_OR_DEPARTMENT_OTHER): Payer: Self-pay | Admitting: Emergency Medicine

## 2024-02-28 ENCOUNTER — Emergency Department (HOSPITAL_BASED_OUTPATIENT_CLINIC_OR_DEPARTMENT_OTHER)

## 2024-02-28 ENCOUNTER — Emergency Department (HOSPITAL_BASED_OUTPATIENT_CLINIC_OR_DEPARTMENT_OTHER): Admission: EM | Admit: 2024-02-28 | Discharge: 2024-02-28 | Disposition: A | Discharge status: Home / Self Care

## 2024-02-28 ENCOUNTER — Other Ambulatory Visit: Payer: Self-pay

## 2024-02-28 DIAGNOSIS — E119 Type 2 diabetes mellitus without complications: Secondary | ICD-10-CM | POA: Insufficient documentation

## 2024-02-28 DIAGNOSIS — Z7901 Long term (current) use of anticoagulants: Secondary | ICD-10-CM | POA: Diagnosis not present

## 2024-02-28 DIAGNOSIS — I10 Essential (primary) hypertension: Secondary | ICD-10-CM | POA: Insufficient documentation

## 2024-02-28 DIAGNOSIS — R319 Hematuria, unspecified: Secondary | ICD-10-CM | POA: Diagnosis present

## 2024-02-28 DIAGNOSIS — Z79899 Other long term (current) drug therapy: Secondary | ICD-10-CM | POA: Diagnosis not present

## 2024-02-28 DIAGNOSIS — R3129 Other microscopic hematuria: Secondary | ICD-10-CM | POA: Diagnosis not present

## 2024-02-28 DIAGNOSIS — Z7982 Long term (current) use of aspirin: Secondary | ICD-10-CM | POA: Diagnosis not present

## 2024-02-28 DIAGNOSIS — N281 Cyst of kidney, acquired: Secondary | ICD-10-CM | POA: Insufficient documentation

## 2024-02-28 DIAGNOSIS — Z7984 Long term (current) use of oral hypoglycemic drugs: Secondary | ICD-10-CM | POA: Diagnosis not present

## 2024-02-28 LAB — COMPREHENSIVE METABOLIC PANEL WITH GFR
ALT: 50 U/L — ABNORMAL HIGH (ref 0–44)
AST: 30 U/L (ref 15–41)
Albumin: 4.3 g/dL (ref 3.5–5.0)
Alkaline Phosphatase: 59 U/L (ref 38–126)
Anion gap: 11 (ref 5–15)
BUN: 16 mg/dL (ref 6–20)
CO2: 27 mmol/L (ref 22–32)
Calcium: 9.8 mg/dL (ref 8.9–10.3)
Chloride: 104 mmol/L (ref 98–111)
Creatinine, Ser: 1.26 mg/dL — ABNORMAL HIGH (ref 0.61–1.24)
GFR, Estimated: >60 mL/min (ref >60)
Glucose, Bld: 128 mg/dL — ABNORMAL HIGH (ref 70–99)
Potassium: 3.8 mmol/L (ref 3.5–5.1)
Sodium: 141 mmol/L (ref 135–145)
Total Bilirubin: 0.7 mg/dL (ref 0.0–1.2)
Total Protein: 7 g/dL (ref 6.5–8.1)

## 2024-02-28 LAB — CBC
HCT: 46.6 % (ref 39.0–52.0)
Hemoglobin: 15.9 g/dL (ref 13.0–17.0)
MCH: 29.6 pg (ref 26.0–34.0)
MCHC: 34.1 g/dL (ref 30.0–36.0)
MCV: 86.6 fL (ref 80.0–100.0)
Platelets: 233 K/uL (ref 150–400)
RBC: 5.38 MIL/uL (ref 4.22–5.81)
RDW: 12 % (ref 11.5–15.5)
WBC: 5.3 K/uL (ref 4.0–10.5)
nRBC: 0 % (ref 0.0–0.2)

## 2024-02-28 LAB — URINALYSIS, ROUTINE W REFLEX MICROSCOPIC
Bilirubin Urine: NEGATIVE
Glucose, UA: NEGATIVE mg/dL
Ketones, ur: NEGATIVE mg/dL
Leukocytes,Ua: NEGATIVE
Nitrite: NEGATIVE
Protein, ur: NEGATIVE mg/dL
Specific Gravity, Urine: 1.025 (ref 1.005–1.030)
pH: 7 (ref 5.0–8.0)

## 2024-02-28 LAB — URINALYSIS, MICROSCOPIC (REFLEX)

## 2024-02-28 MED ORDER — CEFPODOXIME PROXETIL 200 MG PO TABS: 200.0000 mg | ORAL_TABLET | Freq: Two times a day (BID) | ORAL | 0 refills | Status: AC

## 2024-02-28 NOTE — Discharge Instructions (Addendum)
 You were seen in the ER today for evaluation of your symptoms. You may have a UTI. For this, I am treating this with an antibiotic. Please take as prescribed. I have cultured your urine, so if there is no infection they will call you.  You will need to follow-up with a urologist.  I have included information for one of the urologist in [facility] as well as a urology practice in [place].  Please call both to see who can see you sooner.  I have included additional information into the discharge report for you to review.  If you start to have any difficulty with urination, feel you are urinating less or not completely emptying your bladder, have decrease in urination, fevers, belly pain bleeding, or clots, please return to the nearest emergency department for reevaluation.  If you have any other concerns, new or worsening symptoms, please return to your nearest) for evaluation.  **Additionally, your kidney has a cyst and it is recommended that you have an outpatient ultrasound of your kidney. Please call your PCP to schedule.   Contact a health care provider if: Your symptoms don't get better after 1-2 days of taking antibiotics. Your symptoms go away and then come back. You have a fever or chills. You vomit or feel like you may vomit. Get help right away if: You have very bad pain in your back or lower belly. You faint.

## 2024-02-28 NOTE — ED Notes (Signed)
 Pt. Said he woke this morning at 5am to urinate and felt like he was peeing razor blades.  Pt. Did this a couple of times he reports.  Pt. Reports no back pain and no abd. Pain.

## 2024-02-28 NOTE — ED Provider Notes (Signed)
 [facility] Provider Note   CSN: [id number] Arrival date & time: 02/28/24  1318     Patient presents with: Hematuria   Gregory Harvey is a 58 y.o. male with h/o HTN, diabetes, DVT on Xarelto presents emerged from today for evaluation of dysuria with hematuria.  Patient reports that he woke this morning and had some dysuria with some small, seedlike blood clots present.  He reports it was difficult to initiate a stream of urine but once started it was continuous.  He reports that his first urination today did have some bright red blood present but the toilet was not completely full of tinged urine.  He reports that his subsequent urines had some clots but no bright red blood.  He denies any recent trauma or sexual intercourse.  He denies any nausea, vomiting, back pain, abdominal pain, flank pain, diarrhea, constipation, urinary urgency, urinary frequency, fever, rash.  Reports he started having the symptoms today with some dysuria.  Is not having any pain with bowel movements or any melena or hematochezia.  He has never had any previous symptom of this.  Denies any history of kidney stones.  Currently is on Xarelto.  No known drug allergies.  Denies any tobacco, EtOH illicit drug use.   Hematuria Pertinent negatives include no chest pain, no abdominal pain and no shortness of breath.       Prior to Admission medications   Medication Sig Start Date End Date Taking? Authorizing Provider  cefpodoxime (VANTIN) 200 MG tablet Take 1 tablet (200 mg total) by mouth 2 (two) times daily for 10 days. 02/28/24 03/09/24 Yes Bernis Ernst, PA-C  acetaminophen  (TYLENOL ) 500 MG tablet Take 500 mg by mouth every 6 (six) hours as needed for mild pain (pain score 1-3) or moderate pain (pain score 4-6).    [provider]  albuterol  (PROVENTIL  HFA;VENTOLIN  HFA) 108 (90 BASE) MCG/ACT inhaler Inhale 2 puffs into the lungs every 6 (six) hours as needed.  For shortness of breath    [provider]  amLODipine-olmesartan (AZOR) 10-40 MG tablet Take 1 tablet by mouth daily.    [provider]  atorvastatin (LIPITOR) 80 MG tablet Take 80 mg by mouth at bedtime.    [provider]  doxazosin (CARDURA) 2 MG tablet Take 2 mg by mouth at bedtime.    [provider]  EQ ASPIRIN LOW DOSE 81 MG chewable tablet Chew 81 mg by mouth daily.    [provider]  fexofenadine (ALLEGRA) 180 MG tablet Take 180 mg by mouth daily.    [provider]  metFORMIN (GLUCOPHAGE-XR) 500 MG 24 hr tablet Take 500 mg by mouth 2 (two) times daily with a meal.    [provider]  rivaroxaban (XARELTO) 20 MG TABS tablet Take 20 mg by mouth daily with supper. 10/12/23   [provider]  sildenafil (VIAGRA) 100 MG tablet Take 100 mg by mouth daily as needed for erectile dysfunction.    [provider]  Vitamin D, Ergocalciferol, (DRISDOL) 1.25 MG (50000 UNIT) CAPS capsule Take 50,000 Units by mouth 2 (two) times a week.    [provider]    Allergies: Patient has no known allergies.    Review of Systems  Constitutional:  Negative for chills and fever.  Respiratory:  Negative for shortness of breath.   Cardiovascular:  Negative for chest pain.  Gastrointestinal:  Negative for abdominal pain, blood in stool, constipation, diarrhea, nausea and  vomiting.  Genitourinary:  Positive for dysuria and hematuria. Negative for flank pain, frequency, penile swelling, scrotal swelling, testicular pain and urgency.  Skin:  Negative for rash.    Updated Vital Signs BP (!) 142/103   Pulse 88   Temp 98 F (36.7 C)   Resp 18   Wt 102.1 kg   SpO2 96%   BMI 29.69 kg/m   Physical Exam Vitals and nursing note reviewed. Exam conducted with a chaperone present Acie, RN).  Constitutional:      General: He is not in acute distress.    Appearance: He is not ill-appearing or toxic-appearing.  HENT:      Mouth/Throat:     Mouth: Mucous membranes are moist.  Eyes:     General: No scleral icterus. Cardiovascular:     Rate and Rhythm: Normal rate.  Pulmonary:     Effort: Pulmonary effort is normal. No respiratory distress.  Abdominal:     Palpations: Abdomen is soft.     Tenderness: There is no abdominal tenderness. There is no right CVA tenderness, left CVA tenderness, guarding or rebound.  Genitourinary:    Comments: No frank red blood seen in the urethral meatus. No discharge expressed. Some crusted discharge present on the patient's underwear, difficult to tell coloration given the dark color of the fabric.  Skin:    General: Skin is warm and dry.  Neurological:     Mental Status: He is alert.     (all labs ordered are listed, but only abnormal results are displayed) Labs Reviewed  URINALYSIS, ROUTINE W REFLEX MICROSCOPIC - Abnormal; Notable for the following components:      Result Value   APPearance HAZY (*)    Hgb urine dipstick LARGE (*)    All other components within normal limits  COMPREHENSIVE METABOLIC PANEL WITH GFR - Abnormal; Notable for the following components:   Glucose, Bld 128 (*)    Creatinine, Ser 1.26 (*)    ALT 50 (*)    All other components within normal limits  URINALYSIS, MICROSCOPIC (REFLEX) - Abnormal; Notable for the following components:   Bacteria, UA RARE (*)    All other components within normal limits  URINE CULTURE  CBC  GC/CHLAMYDIA PROBE AMP ([place]) NOT AT [facility]    EKG: None  Radiology: CT Renal Stone Study Result Date: 02/28/2024 CLINICAL DATA:  Hematuria. EXAM: CT ABDOMEN AND PELVIS WITHOUT CONTRAST TECHNIQUE: Multidetector CT imaging of the abdomen and pelvis was performed following the standard protocol without IV contrast. RADIATION DOSE REDUCTION: This exam was performed according to the departmental dose-optimization program which includes automated exposure control, adjustment of the mA and/or kV according to patient size  and/or use of iterative reconstruction technique. COMPARISON:  None Available. FINDINGS: Lower chest: No acute abnormality. Hepatobiliary: No focal liver abnormality is seen. The gallbladder is contracted without evidence of gallstones, gallbladder wall thickening, or biliary dilatation. Pancreas: Unremarkable. No pancreatic ductal dilatation or surrounding inflammatory changes. Spleen: Normal in size without focal abnormality. Adrenals/Urinary Tract: Adrenal glands are unremarkable. Kidneys are normal in size, without renal calculi or hydronephrosis. A 6 mm mildly hyperdense focus is seen within the anterolateral aspect of the mid left kidney. There is mild diffuse urinary bladder wall thickening. Stomach/Bowel: Stomach is within normal limits. Appendix appears normal. No evidence of bowel wall thickening, distention, or inflammatory changes. Vascular/Lymphatic: No significant vascular findings are present. No enlarged abdominal or pelvic lymph nodes. Reproductive: Prostate is unremarkable. Other: No abdominal wall hernia or abnormality.  No abdominopelvic ascites. Musculoskeletal: No acute or significant osseous findings. IMPRESSION: 1. Mild diffuse urinary bladder wall thickening which may represent sequelae associated with cystitis. Correlation with urinalysis is recommended. 2. 6 mm mildly hyperdense focus within the anterolateral aspect of the mid left kidney which may represent a small hemorrhagic cyst. Correlation with nonemergent renal ultrasound is recommended. Electronically Signed   By: Suzen Dials M.D.   On: 02/28/2024 15:27    Procedures   Medications Ordered in the ED - No data to display  Medical Decision Making Amount and/or Complexity of Data Reviewed Labs: ordered. Radiology: ordered.  Risk Prescription drug management.   58 y.o. male presents to the ER for evaluation of hematuria. Differential diagnosis includes but is not limited to Cystitis, urinary calculi, renal cell  carcinoma, bladder cancer, glomerulonephritis, polycystic kidneys, anticoagulant usage, interstitial nephritis, BPH, prostate cancer. Vital signs elevated BP, otherwise unremarkable. Physical exam as noted above.   Patient's abdomen soft and nontender to palpation.  No CVA tenderness.  Will obtain basic labs, urine, and CT renal given the reported hematuria.  I independently reviewed and interpreted the patient's labs.  CBC without leukocytosis or anemia.  CMP shows Cr of 1.26 which appears to be close to baseline. Glucose of 128 and alk phos of 50.  Urinalysis shows yellow, hazy urine. There is large amount of hemoglobin present with 21-50 RBC and rare bacteria.  I have ordered a urine culture.  CT Renal nificant osseous findings. IMPRESSION: 1. Mild diffuse urinary bladder wall thickening which may represent sequelae associated with cystitis. Correlation with urinalysis is recommended. 2. 6 mm mildly hyperdense focus within the anterolateral aspect of the mid left kidney which may represent a small hemorrhagic cyst. Correlation with nonemergent renal ultrasound is recommended. Per radiologist's interpretation.    Patient is comfortable lying on stretcher in no acute distress. He denies any urgency or frequency of his urination. He denies having this issue before. He is still able to start a urine stream, however does take some time. Once started, it is steady. I do not think the patient needs CBI or foley catheter at this time. He is afebrile here. He denies any trauma to the penis. No bleeding without urination. Denies any concern for STD. His CT scan shows that he has some bladder wall thickening which could be from UTI. His urinalysis has rare bacteria and blood, but no leukocytes, WBC, or nitrites present. He is currently already on an alpha 1 adrenergic and can not take tamsulosin. Called pharmacy, but there are no other recommended substitutions for this. The patient is still urinating and is  afebrile. I will treat this as a UTI until the culture comes back. Could be having UTI or some bladder outlet obstruction.  Will have patient follow-up with outpatient urology.  I given information for [facility]rology as well as [facility].  Discussed with patient strict return precautions if he were to have any decrease in urination or inability to urinate or if like he is not emptying his bladder or starts getting any fever, flank and abdominal pain return to the Emergency Department immediately.  He verbalizes understanding. Information recommending outpatient follow up for renal cyst given to follow up with PCP. Stable for discharge home.   We discussed the results of the labs/imaging. The plan is to get about taking biotics, follow-up urology, strict return precautions.. We discussed strict return precautions and red flag symptoms. The patient verbalized their understanding and agrees to the plan. The  patient is stable and being discharged home in good condition.  Portions of this report may have been transcribed using voice recognition software. Every effort was made to ensure accuracy; however, inadvertent computerized transcription errors may be present.    Final diagnoses:  Other microscopic hematuria  Renal cyst    ED Discharge Orders          Ordered    cefpodoxime (VANTIN) 200 MG tablet  2 times daily        02/28/24 1653               Bernis Ernst, PA-C 02/28/24 1741    Simon Lavonia SAILOR, MD 02/29/24 [phone]

## 2024-02-28 NOTE — ED Triage Notes (Signed)
 Hematuria and dysuria started today . No abd pain or flank pain , no Hx kidney stones .

## 2024-02-29 LAB — URINE CULTURE: Culture: NO GROWTH

## 2024-02-29 LAB — GC/CHLAMYDIA PROBE AMP (~~LOC~~) NOT AT ARMC
Chlamydia: NEGATIVE
Comment: NEGATIVE
Comment: NORMAL
Neisseria Gonorrhea: NEGATIVE

## 2024-03-19 NOTE — Progress Notes (Deleted)
 DVT Clinic Note  Name: Gregory Harvey     MRN: [id number]     DOB: 06-27-1965     Sex: male  PCP: Lelon Norleen ORN., MD  Today's Visit:    Referred to DVT Clinic by: Emergency Department - Alan Harari, PA-C  Referred to CPP by: {CPP Referral Provider:28391} Reason for referral: No chief complaint on file.  HISTORY OF PRESENT ILLNESS: Gregory Harvey is a 58 y.o. 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 Patient was seen in the ED 08/31/23 for new right calf pain, found to have acute DVT in the right popliteal and peroneal veins. He was discharged on Eliquis  and referred to DVT Clinic. Due to some issues at the pharmacy, he ended up being switched to Xarelto and his PCP had planned for 6 months of treatment. DVT was provoked by a recent drive to [place]  less than 2 weeks prior to symptom onset. His cardiologist has been holding Brilinta while he is on anticoagulation. Last seen in DVT Clinic 10/18/23 at which time the patient was taking Xarelto 20 mg BID and this was corrected to daily. Recommended treating for 3-6 months. Since last visit, patient was seen in the ED 02/28/24 for hematuria and they recommended outpatient urology follow up.   Today, patient reports ***. *** abnormal bleeding or bruising. *** missed doses of ***. *** wearing compression stockings.           Rx Insurance Coverage: Commercial Rx Affordability: Xarelto is ~$50/month. Provided with activated copay card to reduce cost of refills to $10 per 30 or 90 day supply.  Rx Assistance Provided: Co-pay card Preferred Pharmacy: ***  Past Medical History:  Diagnosis Date   Ganglion cyst of wrist 09/2012   recurrent - right wrist   Hypertension    under control with med., has been on med. x 2 yr.   Seasonal allergies    TFCC (triangular fibrocartilage complex) tear 09/2012   right    Past Surgical History:  Procedure  Laterality Date   CYST EXCISION Right    wrist   KNEE ARTHROSCOPY Right    ORCHIOPEXY     WRIST ARTHROSCOPY Right 10/11/2012   Procedure: ARTHROSCOPY RIGHT WRIST POSSIBLE SHRINKAGE DEBRIDEMENT FELDON DOME;  Surgeon: Arley JONELLE Curia, MD;  Location: [facility];  Service: Orthopedics;  Laterality: Right;    Social History   Socioeconomic History   Marital status: Married    Spouse name: Not on file   Number of children: Not on file   Years of education: Not on file   Highest education level: Not on file  Occupational History   Not on file  Tobacco Use   Smoking status: Never   Smokeless tobacco: Never  Vaping Use   Vaping status: Never Used  Substance and Sexual Activity   Alcohol use: No   Drug use: No   Sexual activity: Not on file  Other Topics Concern   Not on file  Social History Narrative   Not on file   Social Drivers of Health   Financial Resource Strain: Not on file  Food Insecurity: Low Risk  (03/14/2024)   Received from [facility]   Hunger Vital Sign    Within the past 12 months, you worried that your food would run out before you got money to buy more: Never true    Within the past 12 months, the  food you bought just didn't last and you didn't have money to get more. : Never true  Transportation Needs: No Transportation Needs (03/14/2024)   Received from [facility]    In the past 12 months, has lack of reliable transportation kept you from medical appointments, meetings, work or from getting things needed for daily living? : No  Physical Activity: Not on file  Stress: Not on file  Social Connections: Not on file  Intimate Partner Violence: Not on file    No family history on file.  Allergies as of 03/20/2024   (No Known Allergies)    Current Outpatient Medications on File Prior to Visit  Medication Sig Dispense Refill   acetaminophen  (TYLENOL ) 500 MG tablet Take 500 mg by mouth every 6 (six) hours as needed for mild  pain (pain score 1-3) or moderate pain (pain score 4-6).     albuterol  (PROVENTIL  HFA;VENTOLIN  HFA) 108 (90 BASE) MCG/ACT inhaler Inhale 2 puffs into the lungs every 6 (six) hours as needed. For shortness of breath     amLODipine-olmesartan (AZOR) 10-40 MG tablet Take 1 tablet by mouth daily.     atorvastatin (LIPITOR) 80 MG tablet Take 80 mg by mouth at bedtime.     doxazosin (CARDURA) 2 MG tablet Take 2 mg by mouth at bedtime.     EQ ASPIRIN LOW DOSE 81 MG chewable tablet Chew 81 mg by mouth daily.     fexofenadine (ALLEGRA) 180 MG tablet Take 180 mg by mouth daily.     metFORMIN (GLUCOPHAGE-XR) 500 MG 24 hr tablet Take 500 mg by mouth 2 (two) times daily with a meal.     rivaroxaban (XARELTO) 20 MG TABS tablet Take 20 mg by mouth daily with supper.     sildenafil (VIAGRA) 100 MG tablet Take 100 mg by mouth daily as needed for erectile dysfunction.     Vitamin D, Ergocalciferol, (DRISDOL) 1.25 MG (50000 UNIT) CAPS capsule Take 50,000 Units by mouth 2 (two) times a week.     No current facility-administered medications on file prior to visit.   REVIEW OF SYSTEMS:  ROS PHYSICAL EXAMINATION:  There were no vitals filed for this visit.  There is no height or weight on file to calculate BMI.  Physical Exam Villalta Score for Post-Thrombotic Syndrome:    LABS:  CBC     Component Value Date/Time   WBC 5.3 02/28/2024 1353   RBC 5.38 02/28/2024 1353   HGB 15.9 02/28/2024 1353   HCT 46.6 02/28/2024 1353   PLT 233 02/28/2024 1353   MCV 86.6 02/28/2024 1353   MCH 29.6 02/28/2024 1353   MCHC 34.1 02/28/2024 1353   RDW 12.0 02/28/2024 1353   LYMPHSABS 1.8 09/28/2011 2243   MONOABS 0.4 09/28/2011 2243   EOSABS 0.1 09/28/2011 2243   BASOSABS 0.0 09/28/2011 2243    Hepatic Function      Component Value Date/Time   PROT 7.0 02/28/2024 1353   ALBUMIN 4.3 02/28/2024 1353   AST 30 02/28/2024 1353   ALT 50 (H) 02/28/2024 1353   ALKPHOS 59 02/28/2024 1353   BILITOT 0.7 02/28/2024 1353     Renal Function   Lab Results  Component Value Date   CREATININE 1.26 (H) 02/28/2024   CREATININE 1.14 10/10/2012   CREATININE 1.20 09/28/2011    CrCl cannot be calculated (Unknown ideal weight.).   [facility] Vascular Lab Studies:  08/31/23 DVT study  FINDINGS: Contralateral Common Femoral Vein: Respiratory phasicity is normal and symmetric  with the symptomatic side. No evidence of thrombus. Normal compressibility.   Common Femoral Vein: No evidence of thrombus. Normal compressibility, respiratory phasicity and response to augmentation.   Saphenofemoral Junction: No evidence of thrombus. Normal compressibility and flow on color Doppler imaging.   Profunda Femoral Vein: No evidence of thrombus. Normal compressibility and flow on color Doppler imaging.   Femoral Vein: No evidence of thrombus. Normal compressibility, respiratory phasicity and response to augmentation.   Popliteal Vein: Intraluminal filling defect seen within the right popliteal vein resulting in decreased flow and compressibility, consistent with acute DVT.   Calf Veins: Intraluminal filling defect seen within the peroneal vein resulting in decreased flow and compressibility, consistent with acute DVT. Posterior tibial vein is patent.   Superficial Great Saphenous Vein: No evidence of thrombus. Normal compressibility.   Venous Reflux:  None.   Other Findings:  None.   IMPRESSION: Acute DVT of the right popliteal and peroneal veins.  ASSESSMENT:    Patient without prior history of DVT diagnosed with acute DVT in the right popliteal and peroneal veins on 08/31/23 s/p prolonged immobility (8-10 hour car ride to [place]  and back) within the two weeks prior to symptom onset. We have planned to treat his first provoked DVT with 3-6 months of anticoagulation. His PCP provided refills to complete 6 months of treatment. With anticoagulation start date of 09/15/23, he has now completed 6 months and can discontinue  Xarelto. Patient needs to call his cardiologist to confirm the plan for his Brilinta. Per their last note, it appears the plan was to resume Brilinta upon Xarelto discontinuation. ***  PLAN: {DVT Clinic Eojw:71604}  Patient is discharged from the DVT Clinic:  {DVT CLINIC DISCHARGE PWQN:71609} Follow up: ***  Lum Herald, PharmD, BCACP, CPP [facility] [phone]

## 2024-03-20 ENCOUNTER — Ambulatory Visit: Attending: Student-PharmD | Admitting: Student-PharmD

## 2024-04-16 ENCOUNTER — Encounter (HOSPITAL_BASED_OUTPATIENT_CLINIC_OR_DEPARTMENT_OTHER): Payer: Self-pay | Admitting: Emergency Medicine

## 2024-04-16 ENCOUNTER — Emergency Department (HOSPITAL_BASED_OUTPATIENT_CLINIC_OR_DEPARTMENT_OTHER)
Admission: EM | Admit: 2024-04-16 | Discharge: 2024-04-16 | Disposition: A | Discharge status: Home / Self Care | Attending: Emergency Medicine | Admitting: Emergency Medicine

## 2024-04-16 ENCOUNTER — Other Ambulatory Visit: Payer: Self-pay

## 2024-04-16 DIAGNOSIS — Z79899 Other long term (current) drug therapy: Secondary | ICD-10-CM | POA: Insufficient documentation

## 2024-04-16 DIAGNOSIS — K920 Hematemesis: Secondary | ICD-10-CM | POA: Insufficient documentation

## 2024-04-16 DIAGNOSIS — I10 Essential (primary) hypertension: Secondary | ICD-10-CM | POA: Insufficient documentation

## 2024-04-16 DIAGNOSIS — Z7901 Long term (current) use of anticoagulants: Secondary | ICD-10-CM | POA: Diagnosis not present

## 2024-04-16 LAB — COMPREHENSIVE METABOLIC PANEL WITH GFR
ALT: 39 U/L (ref 0–44)
AST: 26 U/L (ref 15–41)
Albumin: 4.5 g/dL (ref 3.5–5.0)
Alkaline Phosphatase: 57 U/L (ref 38–126)
Anion gap: 11 (ref 5–15)
BUN: 14 mg/dL (ref 6–20)
CO2: 26 mmol/L (ref 22–32)
Calcium: 10.1 mg/dL (ref 8.9–10.3)
Chloride: 102 mmol/L (ref 98–111)
Creatinine, Ser: 1.31 mg/dL — ABNORMAL HIGH (ref 0.61–1.24)
GFR, Estimated: >60 mL/min (ref >60)
Glucose, Bld: 126 mg/dL — ABNORMAL HIGH (ref 70–99)
Potassium: 4.5 mmol/L (ref 3.5–5.1)
Sodium: 139 mmol/L (ref 135–145)
Total Bilirubin: 1.1 mg/dL (ref 0.0–1.2)
Total Protein: 7.6 g/dL (ref 6.5–8.1)

## 2024-04-16 LAB — URINALYSIS, ROUTINE W REFLEX MICROSCOPIC
Bilirubin Urine: NEGATIVE
Glucose, UA: NEGATIVE mg/dL
Hgb urine dipstick: NEGATIVE
Ketones, ur: NEGATIVE mg/dL
Leukocytes,Ua: NEGATIVE
Nitrite: NEGATIVE
Protein, ur: 100 mg/dL — AB
Specific Gravity, Urine: 1.02 (ref 1.005–1.030)
pH: 8 (ref 5.0–8.0)

## 2024-04-16 LAB — CBC
HCT: 49.8 % (ref 39.0–52.0)
Hemoglobin: 16.7 g/dL (ref 13.0–17.0)
MCH: 29.3 pg (ref 26.0–34.0)
MCHC: 33.5 g/dL (ref 30.0–36.0)
MCV: 87.4 fL (ref 80.0–100.0)
Platelets: 238 K/uL (ref 150–400)
RBC: 5.7 MIL/uL (ref 4.22–5.81)
RDW: 12.2 % (ref 11.5–15.5)
WBC: 5.9 K/uL (ref 4.0–10.5)
nRBC: 0 % (ref 0.0–0.2)

## 2024-04-16 LAB — URINALYSIS, MICROSCOPIC (REFLEX)

## 2024-04-16 LAB — LIPASE, BLOOD: Lipase: 26 U/L (ref 11–51)

## 2024-04-16 MED ORDER — ONDANSETRON 4 MG PO TBDP: 4.0000 mg | ORAL_TABLET | Freq: Three times a day (TID) | ORAL | 0 refills | Status: AC | PRN

## 2024-04-16 NOTE — ED Notes (Signed)
 Patient not able to urinate at this time Will ask again  later

## 2024-04-16 NOTE — Discharge Instructions (Signed)
You have been seen in the Emergency Department (ED) today for nausea and vomiting.  Your work up today has not shown a clear cause for your symptoms. °You have been prescribed Zofran; please use as prescribed as needed for your nausea. ° °Follow up with your doctor as soon as possible regarding today?s emergent visit and your symptoms of nausea.  ° °Return to the ED if you develop abdominal, bloody vomiting, bloody diarrhea, if you are unable to tolerate fluids due to vomiting, or if you develop other symptoms that concern you. ° °

## 2024-04-16 NOTE — ED Provider Notes (Signed)
 [place] [facility] Provider Note   CSN: [id number] Arrival date & time: 04/16/24  1210     Patient presents with: Hematemesis   Gregory Harvey is a 58 y.o. male with PMH of hypertension and ganglion cyst of wrist that presents with about 3 episodes of vomiting with frank bright red blood that he said was a little bit more than a streak.  He states that he took his normal medications this morning and that went without eating and felt nauseous and had to vomit.  After 3 episodes of vomiting patient says that he did not have any more nausea and just had slightly uncomfortable sore throat from the vomiting.  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  Patient states that he is feeling fine and denies any chest pain or shortness of breath.   HPI     Prior to Admission medications   Medication Sig Start Date End Date Taking? Authorizing Provider  ondansetron (ZOFRAN-ODT) 4 MG disintegrating tablet Take 1 tablet (4 mg total) by mouth every 8 (eight) hours as needed. 04/16/24  Yes Long, Fonda MATSU, MD  acetaminophen  (TYLENOL ) 500 MG tablet Take 500 mg by mouth every 6 (six) hours as needed for mild pain (pain score 1-3) or moderate pain (pain score 4-6).    [provider]  albuterol  (PROVENTIL  HFA;VENTOLIN  HFA) 108 (90 BASE) MCG/ACT inhaler Inhale 2 puffs into the lungs every 6 (six) hours as needed. For shortness of breath    [provider]  amLODipine-olmesartan (AZOR) 10-40 MG tablet Take 1 tablet by mouth daily.    [provider]  atorvastatin (LIPITOR) 80 MG tablet Take 80 mg by mouth at bedtime.    [provider]  doxazosin (CARDURA) 2 MG tablet Take 2 mg by mouth at bedtime.    [provider]  EQ ASPIRIN LOW DOSE 81 MG chewable tablet Chew 81 mg by mouth daily.    [provider]  fexofenadine (ALLEGRA) 180 MG tablet Take 180 mg by mouth daily.    [provider]  metFORMIN (GLUCOPHAGE-XR) 500 MG 24 hr tablet Take 500 mg by mouth 2 (two) times daily with a meal.    [provider]  rivaroxaban (XARELTO) 20 MG TABS tablet Take 20 mg by mouth daily with supper. 10/12/23   [provider]  sildenafil (VIAGRA) 100 MG tablet Take 100 mg by mouth daily as needed for erectile dysfunction.    [provider]  Vitamin D, Ergocalciferol, (DRISDOL) 1.25 MG (50000 UNIT) CAPS capsule Take 50,000 Units by mouth 2 (two) times a week.    [provider]    Allergies: Patient has no known allergies.    Review of Systems As noted in HPI Updated Vital Signs BP (!) 129/99 (BP Location: Right Arm)   Pulse 78   Temp 98.3 F (36.8 C) (Oral)   Resp 20   Ht 6' 1 (1.854 m)   Wt 102.1 kg   SpO2 94%   BMI 29.70 kg/m   Physical Exam Constitutional:      General: He is not in acute distress.    Appearance: Normal appearance. He is not ill-appearing or toxic-appearing.  Cardiovascular:     Rate and Rhythm: Normal rate and regular rhythm.     Heart sounds: No  murmur heard. Chest:     Chest wall: No tenderness.  Abdominal:     General: There is no distension.     Palpations: Abdomen is soft.     Tenderness: There is no abdominal tenderness.  Neurological:     Mental Status: He is alert.     (all labs ordered are listed, but only abnormal results are displayed) Labs Reviewed  COMPREHENSIVE METABOLIC PANEL WITH GFR - Abnormal; Notable for the following components:      Result Value   Glucose, Bld 126 (*)    Creatinine, Ser 1.31 (*)    All other components within normal limits  LIPASE, BLOOD  CBC  URINALYSIS, ROUTINE W REFLEX MICROSCOPIC    EKG: None  Radiology: No results found.   Procedures   Medications Ordered in the ED - No data to display                                  Medical Decision  Making Amount and/or Complexity of Data Reviewed Labs: ordered.  Risk Prescription drug management.   Differentials include peptic ulcer disease, gastritis, esophageal varices, Mallory-Weiss tear, reflux esophagitis, gastric carcinoma, Boerhaave syndrome.  Vital signs are stable.  Patient is not in any acute distress.  Patient is not having any chest pain at this time so we will lower differential for any ACS.  No palpable crepitus noted on exam.  Will check blood counts with CBC to make sure this is stable.  CMP shows a mildly elevated creatinine at 1.31 which is relatively stable from 1.26.  Continue to follow-up with PCP about this.  Lipase and urinalysis were ordered.  Lipase is normal.  UA does not show any leukocytes and nitrites and patient is not complaining of any symptoms no infection likely.  Hemoglobin is stable at 16.7.  Patient is stable for discharge no transfusion needed.  Return precautions given.  Will send home with Zofran for any more nausea.     Final diagnoses:  Hematemesis with nausea    ED Discharge Orders          Ordered    ondansetron (ZOFRAN-ODT) 4 MG disintegrating tablet  Every 8 hours PRN        04/16/24 1315               D'Mello, Shauntia Levengood, DO 04/16/24 1346    Long, Joshua G, MD 04/17/24 0725

## 2024-04-16 NOTE — ED Triage Notes (Signed)
 Pt reports taking medication on empty stomach, resulted in abd pain, hematemesis x 2.

## 2024-04-16 NOTE — ED Notes (Signed)
 Report received from Marolyn, [place]. Assuming pt care at this time.
